# Patient Record
Sex: Male | Born: 1945 | Race: White | Hispanic: No | Marital: Married | Smoking: Former smoker
Health system: Southern US, Community
[De-identification: ages and names within clinical notes are randomized; demographics above are authoritative.]

## PROBLEM LIST (undated history)

## (undated) DIAGNOSIS — E785 Hyperlipidemia, unspecified: Secondary | ICD-10-CM

## (undated) DIAGNOSIS — H1851 Endothelial corneal dystrophy: Secondary | ICD-10-CM

## (undated) DIAGNOSIS — H18519 Endothelial corneal dystrophy, unspecified eye: Secondary | ICD-10-CM

## (undated) DIAGNOSIS — J449 Chronic obstructive pulmonary disease, unspecified: Secondary | ICD-10-CM

## (undated) DIAGNOSIS — K219 Gastro-esophageal reflux disease without esophagitis: Secondary | ICD-10-CM

## (undated) DIAGNOSIS — Z85820 Personal history of malignant melanoma of skin: Secondary | ICD-10-CM

## (undated) DIAGNOSIS — M1990 Unspecified osteoarthritis, unspecified site: Secondary | ICD-10-CM

## (undated) DIAGNOSIS — N320 Bladder-neck obstruction: Secondary | ICD-10-CM

## (undated) DIAGNOSIS — Z9889 Other specified postprocedural states: Secondary | ICD-10-CM

## (undated) DIAGNOSIS — F909 Attention-deficit hyperactivity disorder, unspecified type: Secondary | ICD-10-CM

## (undated) DIAGNOSIS — I444 Left anterior fascicular block: Secondary | ICD-10-CM

## (undated) DIAGNOSIS — Z8546 Personal history of malignant neoplasm of prostate: Secondary | ICD-10-CM

## (undated) DIAGNOSIS — J302 Other seasonal allergic rhinitis: Secondary | ICD-10-CM

## (undated) DIAGNOSIS — N529 Male erectile dysfunction, unspecified: Secondary | ICD-10-CM

## (undated) DIAGNOSIS — R918 Other nonspecific abnormal finding of lung field: Secondary | ICD-10-CM

## (undated) HISTORY — PX: ADENOIDECTOMY: SUR15

## (undated) HISTORY — PX: TONSILLECTOMY: SUR1361

## (undated) HISTORY — DX: Hyperlipidemia, unspecified: E78.5

## (undated) HISTORY — PX: CATARACT EXTRACTION W/ INTRAOCULAR LENS  IMPLANT, BILATERAL: SHX1307

## (undated) HISTORY — DX: Gastro-esophageal reflux disease without esophagitis: K21.9

---

## 1987-10-23 HISTORY — PX: HIATAL HERNIA REPAIR: SHX195

## 1989-06-22 HISTORY — PX: KNEE ARTHROSCOPY: SUR90

## 1995-10-23 HISTORY — PX: TOTAL KNEE ARTHROPLASTY: SHX125

## 1999-08-09 ENCOUNTER — Other Ambulatory Visit: Admission: RE | Admit: 1999-08-09 | Discharge: 1999-08-09 | Payer: Self-pay | Admitting: Gastroenterology

## 1999-08-09 ENCOUNTER — Encounter (INDEPENDENT_AMBULATORY_CARE_PROVIDER_SITE_OTHER): Payer: Self-pay | Admitting: Specialist

## 2003-08-17 ENCOUNTER — Ambulatory Visit (HOSPITAL_COMMUNITY): Admission: RE | Admit: 2003-08-17 | Discharge: 2003-08-17 | Payer: Self-pay | Admitting: Urology

## 2003-08-30 ENCOUNTER — Ambulatory Visit: Admission: RE | Admit: 2003-08-30 | Discharge: 2003-10-18 | Payer: Self-pay | Admitting: Radiation Oncology

## 2003-11-08 ENCOUNTER — Inpatient Hospital Stay (HOSPITAL_COMMUNITY): Admission: RE | Admit: 2003-11-08 | Discharge: 2003-11-10 | Payer: Self-pay | Admitting: Urology

## 2003-11-08 ENCOUNTER — Encounter (INDEPENDENT_AMBULATORY_CARE_PROVIDER_SITE_OTHER): Payer: Self-pay | Admitting: Specialist

## 2003-11-08 HISTORY — PX: OTHER SURGICAL HISTORY: SHX169

## 2004-07-19 DIAGNOSIS — C61 Malignant neoplasm of prostate: Secondary | ICD-10-CM

## 2004-10-13 ENCOUNTER — Ambulatory Visit: Payer: Self-pay | Admitting: Internal Medicine

## 2004-11-13 ENCOUNTER — Ambulatory Visit: Payer: Self-pay | Admitting: Internal Medicine

## 2005-05-11 ENCOUNTER — Ambulatory Visit: Payer: Self-pay | Admitting: Internal Medicine

## 2005-06-07 ENCOUNTER — Ambulatory Visit: Payer: Self-pay | Admitting: Internal Medicine

## 2005-08-07 ENCOUNTER — Ambulatory Visit: Payer: Self-pay | Admitting: Internal Medicine

## 2005-08-14 ENCOUNTER — Ambulatory Visit: Payer: Self-pay | Admitting: Internal Medicine

## 2005-10-12 ENCOUNTER — Ambulatory Visit: Payer: Self-pay | Admitting: Internal Medicine

## 2005-11-20 ENCOUNTER — Ambulatory Visit: Payer: Self-pay | Admitting: Internal Medicine

## 2005-11-27 ENCOUNTER — Ambulatory Visit: Payer: Self-pay | Admitting: Internal Medicine

## 2005-12-25 ENCOUNTER — Ambulatory Visit: Payer: Self-pay | Admitting: Internal Medicine

## 2006-03-28 ENCOUNTER — Ambulatory Visit: Payer: Self-pay | Admitting: Internal Medicine

## 2006-05-27 ENCOUNTER — Ambulatory Visit: Payer: Self-pay | Admitting: Internal Medicine

## 2006-08-29 ENCOUNTER — Ambulatory Visit: Payer: Self-pay | Admitting: Internal Medicine

## 2006-10-17 ENCOUNTER — Ambulatory Visit: Payer: Self-pay | Admitting: Internal Medicine

## 2006-10-17 LAB — CONVERTED CEMR LAB
ALT: 43 units/L — ABNORMAL HIGH (ref 0–40)
AST: 27 units/L (ref 0–37)
Albumin: 3.9 g/dL (ref 3.5–5.2)
Alkaline Phosphatase: 52 units/L (ref 39–117)
BUN: 15 mg/dL (ref 6–23)
CO2: 31 meq/L (ref 19–32)
Calcium: 9.2 mg/dL (ref 8.4–10.5)
Chloride: 104 meq/L (ref 96–112)
Chol/HDL Ratio, serum: 3.9
Cholesterol: 193 mg/dL (ref 0–200)
Creatinine, Ser: 1.1 mg/dL (ref 0.4–1.5)
GFR calc non Af Amer: 72 mL/min
Glomerular Filtration Rate, Af Am: 88 mL/min/{1.73_m2}
Glucose, Bld: 88 mg/dL (ref 70–99)
HDL: 49.8 mg/dL (ref >39.0)
LDL Cholesterol: 120 mg/dL — ABNORMAL HIGH (ref 0–99)
Potassium: 4.4 meq/L (ref 3.5–5.1)
Sodium: 140 meq/L (ref 135–145)
Total Bilirubin: 1.2 mg/dL (ref 0.3–1.2)
Total Protein: 6.5 g/dL (ref 6.0–8.3)
Triglyceride fasting, serum: 117 mg/dL (ref 0–149)
VLDL: 23 mg/dL (ref 0–40)

## 2006-11-08 ENCOUNTER — Ambulatory Visit: Payer: Self-pay | Admitting: Internal Medicine

## 2006-11-14 ENCOUNTER — Ambulatory Visit: Payer: Self-pay

## 2006-11-14 ENCOUNTER — Ambulatory Visit: Payer: Self-pay | Admitting: Cardiology

## 2006-11-18 ENCOUNTER — Ambulatory Visit: Payer: Self-pay

## 2006-11-18 HISTORY — PX: CARDIOVASCULAR STRESS TEST: SHX262

## 2007-01-02 ENCOUNTER — Ambulatory Visit: Payer: Self-pay | Admitting: Internal Medicine

## 2007-02-10 ENCOUNTER — Ambulatory Visit: Payer: Self-pay | Admitting: Internal Medicine

## 2007-02-10 LAB — CONVERTED CEMR LAB
ALT: 43 units/L — ABNORMAL HIGH (ref 0–40)
AST: 29 units/L (ref 0–37)
Albumin: 3.7 g/dL (ref 3.5–5.2)
Alkaline Phosphatase: 45 units/L (ref 39–117)
Bilirubin, Direct: 0.1 mg/dL (ref 0.0–0.3)
Cholesterol: 168 mg/dL (ref 0–200)
HDL: 37.9 mg/dL — ABNORMAL LOW (ref >39.0)
LDL Cholesterol: 107 mg/dL — ABNORMAL HIGH (ref 0–99)
PSA: 0 ng/mL — ABNORMAL LOW (ref 0.10–4.00)
Testosterone: 700.27 ng/dL (ref 350.00–890)
Total Bilirubin: 0.8 mg/dL (ref 0.3–1.2)
Total CHOL/HDL Ratio: 4.4
Total Protein: 6.5 g/dL (ref 6.0–8.3)
Triglycerides: 116 mg/dL (ref 0–149)
VLDL: 23 mg/dL (ref 0–40)

## 2007-02-18 ENCOUNTER — Ambulatory Visit: Payer: Self-pay | Admitting: Internal Medicine

## 2007-03-19 ENCOUNTER — Encounter: Admission: RE | Admit: 2007-03-19 | Discharge: 2007-03-19 | Payer: Self-pay | Admitting: Orthopedic Surgery

## 2007-05-06 DIAGNOSIS — E291 Testicular hypofunction: Secondary | ICD-10-CM | POA: Insufficient documentation

## 2007-05-06 DIAGNOSIS — K589 Irritable bowel syndrome without diarrhea: Secondary | ICD-10-CM | POA: Insufficient documentation

## 2007-05-06 DIAGNOSIS — E785 Hyperlipidemia, unspecified: Secondary | ICD-10-CM | POA: Insufficient documentation

## 2007-05-06 DIAGNOSIS — K219 Gastro-esophageal reflux disease without esophagitis: Secondary | ICD-10-CM | POA: Insufficient documentation

## 2007-05-19 ENCOUNTER — Ambulatory Visit: Payer: Self-pay | Admitting: Internal Medicine

## 2007-05-19 DIAGNOSIS — J309 Allergic rhinitis, unspecified: Secondary | ICD-10-CM | POA: Insufficient documentation

## 2007-05-19 DIAGNOSIS — M1710 Unilateral primary osteoarthritis, unspecified knee: Secondary | ICD-10-CM | POA: Insufficient documentation

## 2007-09-22 ENCOUNTER — Ambulatory Visit: Payer: Self-pay | Admitting: Internal Medicine

## 2007-09-22 DIAGNOSIS — J0110 Acute frontal sinusitis, unspecified: Secondary | ICD-10-CM | POA: Insufficient documentation

## 2007-09-22 DIAGNOSIS — H6120 Impacted cerumen, unspecified ear: Secondary | ICD-10-CM | POA: Insufficient documentation

## 2007-10-02 ENCOUNTER — Ambulatory Visit: Payer: Self-pay | Admitting: Internal Medicine

## 2007-10-02 ENCOUNTER — Telehealth: Payer: Self-pay | Admitting: Internal Medicine

## 2008-01-02 ENCOUNTER — Ambulatory Visit: Payer: Self-pay | Admitting: Internal Medicine

## 2008-01-02 DIAGNOSIS — T887XXA Unspecified adverse effect of drug or medicament, initial encounter: Secondary | ICD-10-CM | POA: Insufficient documentation

## 2008-01-02 LAB — CONVERTED CEMR LAB
BUN: 14 mg/dL (ref 6–23)
CO2: 30 meq/L (ref 19–32)
Calcium: 9.8 mg/dL (ref 8.4–10.5)
Chloride: 105 meq/L (ref 96–112)
Creatinine, Ser: 1.2 mg/dL (ref 0.4–1.5)
GFR calc Af Amer: 79 mL/min
GFR calc non Af Amer: 65 mL/min
Glucose, Bld: 92 mg/dL (ref 70–99)
Potassium: 4.8 meq/L (ref 3.5–5.1)
Sodium: 142 meq/L (ref 135–145)

## 2008-01-09 ENCOUNTER — Ambulatory Visit: Payer: Self-pay | Admitting: Cardiology

## 2008-01-13 ENCOUNTER — Telehealth: Payer: Self-pay | Admitting: Internal Medicine

## 2008-01-14 ENCOUNTER — Telehealth (INDEPENDENT_AMBULATORY_CARE_PROVIDER_SITE_OTHER): Payer: Self-pay | Admitting: *Deleted

## 2008-01-16 ENCOUNTER — Ambulatory Visit: Payer: Self-pay | Admitting: Critical Care Medicine

## 2008-01-16 DIAGNOSIS — J984 Other disorders of lung: Secondary | ICD-10-CM

## 2008-01-16 LAB — CONVERTED CEMR LAB: PSA: 0 ng/mL — ABNORMAL LOW (ref 0.10–4.00)

## 2008-01-28 ENCOUNTER — Ambulatory Visit: Payer: Self-pay | Admitting: Internal Medicine

## 2008-02-13 ENCOUNTER — Ambulatory Visit: Payer: Self-pay | Admitting: Internal Medicine

## 2008-02-13 LAB — CONVERTED CEMR LAB
ALT: 52 units/L (ref 0–53)
AST: 27 units/L (ref 0–37)
Albumin: 3.8 g/dL (ref 3.5–5.2)
Alkaline Phosphatase: 39 units/L (ref 39–117)
Bilirubin, Direct: 0.1 mg/dL (ref 0.0–0.3)
Cholesterol: 165 mg/dL (ref 0–200)
HDL: 38.6 mg/dL — ABNORMAL LOW (ref >39.0)
LDL Cholesterol: 110 mg/dL — ABNORMAL HIGH (ref 0–99)
Total Bilirubin: 0.8 mg/dL (ref 0.3–1.2)
Total CHOL/HDL Ratio: 4.3
Total Protein: 6.7 g/dL (ref 6.0–8.3)
Triglycerides: 80 mg/dL (ref 0–149)
VLDL: 16 mg/dL (ref 0–40)

## 2008-02-20 ENCOUNTER — Ambulatory Visit: Payer: Self-pay | Admitting: Internal Medicine

## 2008-03-09 ENCOUNTER — Encounter: Payer: Self-pay | Admitting: Internal Medicine

## 2008-04-01 ENCOUNTER — Telehealth: Payer: Self-pay | Admitting: Critical Care Medicine

## 2008-04-02 ENCOUNTER — Ambulatory Visit: Payer: Self-pay | Admitting: Cardiology

## 2008-04-02 ENCOUNTER — Encounter: Payer: Self-pay | Admitting: Critical Care Medicine

## 2008-04-06 ENCOUNTER — Ambulatory Visit: Payer: Self-pay | Admitting: Internal Medicine

## 2008-04-06 DIAGNOSIS — K7689 Other specified diseases of liver: Secondary | ICD-10-CM

## 2008-07-06 ENCOUNTER — Ambulatory Visit: Payer: Self-pay | Admitting: Internal Medicine

## 2008-07-06 LAB — CONVERTED CEMR LAB
ALT: 29 units/L (ref 0–53)
AST: 25 units/L (ref 0–37)
Albumin: 3.8 g/dL (ref 3.5–5.2)
Alkaline Phosphatase: 33 units/L — ABNORMAL LOW (ref 39–117)
Bilirubin, Direct: 0.1 mg/dL (ref 0.0–0.3)
Cholesterol: 151 mg/dL (ref 0–200)
HDL: 36.8 mg/dL — ABNORMAL LOW (ref >39.0)
LDL Cholesterol: 101 mg/dL — ABNORMAL HIGH (ref 0–99)
Total Bilirubin: 0.8 mg/dL (ref 0.3–1.2)
Total CHOL/HDL Ratio: 4.1
Total Protein: 7.4 g/dL (ref 6.0–8.3)
Triglycerides: 68 mg/dL (ref 0–149)
VLDL: 14 mg/dL (ref 0–40)

## 2008-07-12 ENCOUNTER — Ambulatory Visit: Payer: Self-pay | Admitting: Internal Medicine

## 2008-07-12 DIAGNOSIS — L570 Actinic keratosis: Secondary | ICD-10-CM

## 2008-07-16 LAB — CONVERTED CEMR LAB: Vit D, 1,25-Dihydroxy: 35 (ref 30–89)

## 2008-08-02 ENCOUNTER — Telehealth: Payer: Self-pay | Admitting: Internal Medicine

## 2008-10-22 HISTORY — PX: OTHER SURGICAL HISTORY: SHX169

## 2008-11-18 ENCOUNTER — Encounter: Payer: Self-pay | Admitting: Internal Medicine

## 2008-12-07 ENCOUNTER — Encounter: Payer: Self-pay | Admitting: Critical Care Medicine

## 2008-12-07 ENCOUNTER — Ambulatory Visit: Payer: Self-pay | Admitting: Cardiology

## 2008-12-24 ENCOUNTER — Ambulatory Visit: Payer: Self-pay | Admitting: Internal Medicine

## 2008-12-24 LAB — CONVERTED CEMR LAB
ALT: 31 units/L (ref 0–53)
AST: 23 units/L (ref 0–37)
Albumin: 3.8 g/dL (ref 3.5–5.2)
Alkaline Phosphatase: 35 units/L — ABNORMAL LOW (ref 39–117)
BUN: 18 mg/dL (ref 6–23)
Basophils Absolute: 0 10*3/uL (ref 0.0–0.1)
Basophils Relative: 0.1 % (ref 0.0–3.0)
Bilirubin Urine: NEGATIVE
Bilirubin, Direct: 0.1 mg/dL (ref 0.0–0.3)
CO2: 33 meq/L — ABNORMAL HIGH (ref 19–32)
Calcium: 9.7 mg/dL (ref 8.4–10.5)
Chloride: 103 meq/L (ref 96–112)
Cholesterol: 161 mg/dL (ref 0–200)
Creatinine, Ser: 1.3 mg/dL (ref 0.4–1.5)
Eosinophils Absolute: 0.2 10*3/uL (ref 0.0–0.7)
Eosinophils Relative: 4.2 % (ref 0.0–5.0)
GFR calc Af Amer: 71 mL/min
GFR calc non Af Amer: 59 mL/min
Glucose, Bld: 94 mg/dL (ref 70–99)
Glucose, Urine, Semiquant: NEGATIVE
HCT: 47.7 % (ref 39.0–52.0)
HDL: 43.3 mg/dL (ref >39.0)
Hemoglobin: 16.3 g/dL (ref 13.0–17.0)
Ketones, urine, test strip: NEGATIVE
LDL Cholesterol: 90 mg/dL (ref 0–99)
Lymphocytes Relative: 35 % (ref 12.0–46.0)
MCHC: 34.2 g/dL (ref 30.0–36.0)
MCV: 88 fL (ref 78.0–100.0)
Monocytes Absolute: 0.5 10*3/uL (ref 0.1–1.0)
Monocytes Relative: 8.2 % (ref 3.0–12.0)
Neutro Abs: 2.9 10*3/uL (ref 1.4–7.7)
Neutrophils Relative %: 52.5 % (ref 43.0–77.0)
Nitrite: NEGATIVE
PSA: 0.02 ng/mL — ABNORMAL LOW (ref 0.10–4.00)
Platelets: 186 10*3/uL (ref 150–400)
Potassium: 4.2 meq/L (ref 3.5–5.1)
Protein, U semiquant: NEGATIVE
RBC: 5.42 M/uL (ref 4.22–5.81)
RDW: 12.3 % (ref 11.5–14.6)
Sodium: 145 meq/L (ref 135–145)
Specific Gravity, Urine: 1.02
TSH: 1.04 microintl units/mL (ref 0.35–5.50)
Total Bilirubin: 0.9 mg/dL (ref 0.3–1.2)
Total CHOL/HDL Ratio: 3.7
Total Protein: 7 g/dL (ref 6.0–8.3)
Triglycerides: 137 mg/dL (ref 0–149)
Urobilinogen, UA: 0.2
VLDL: 27 mg/dL (ref 0–40)
WBC Urine, dipstick: NEGATIVE
WBC: 5.6 10*3/uL (ref 4.5–10.5)
pH: 6

## 2008-12-31 ENCOUNTER — Ambulatory Visit: Payer: Self-pay | Admitting: Internal Medicine

## 2009-02-22 ENCOUNTER — Telehealth: Payer: Self-pay | Admitting: Internal Medicine

## 2009-03-04 ENCOUNTER — Ambulatory Visit: Payer: Self-pay | Admitting: Psychology

## 2009-03-23 ENCOUNTER — Encounter: Payer: Self-pay | Admitting: Internal Medicine

## 2009-04-01 ENCOUNTER — Ambulatory Visit: Payer: Self-pay | Admitting: Psychology

## 2009-04-22 ENCOUNTER — Ambulatory Visit: Payer: Self-pay | Admitting: Psychology

## 2009-05-12 ENCOUNTER — Ambulatory Visit: Payer: Self-pay | Admitting: Psychology

## 2009-05-27 ENCOUNTER — Ambulatory Visit: Payer: Self-pay | Admitting: Psychology

## 2009-06-03 ENCOUNTER — Telehealth: Payer: Self-pay | Admitting: Internal Medicine

## 2009-06-03 DIAGNOSIS — M79609 Pain in unspecified limb: Secondary | ICD-10-CM

## 2009-06-08 ENCOUNTER — Encounter: Payer: Self-pay | Admitting: Internal Medicine

## 2009-06-09 ENCOUNTER — Telehealth: Payer: Self-pay | Admitting: Internal Medicine

## 2009-06-09 ENCOUNTER — Ambulatory Visit: Payer: Self-pay | Admitting: Psychology

## 2009-06-16 ENCOUNTER — Ambulatory Visit: Payer: Self-pay | Admitting: Psychology

## 2009-06-23 ENCOUNTER — Ambulatory Visit: Payer: Self-pay | Admitting: Psychology

## 2009-06-24 ENCOUNTER — Ambulatory Visit: Payer: Self-pay | Admitting: Internal Medicine

## 2009-06-24 LAB — CONVERTED CEMR LAB
ALT: 40 units/L (ref 0–53)
AST: 31 units/L (ref 0–37)
Albumin: 4 g/dL (ref 3.5–5.2)
Alkaline Phosphatase: 31 units/L — ABNORMAL LOW (ref 39–117)
Bilirubin, Direct: 0.2 mg/dL (ref 0.0–0.3)
Cholesterol: 144 mg/dL (ref 0–200)
HDL: 41.5 mg/dL (ref >39.00)
LDL Cholesterol: 87 mg/dL (ref 0–99)
Total Bilirubin: 1.2 mg/dL (ref 0.3–1.2)
Total CHOL/HDL Ratio: 3
Total Protein: 7.1 g/dL (ref 6.0–8.3)
Triglycerides: 77 mg/dL (ref 0.0–149.0)
VLDL: 15.4 mg/dL (ref 0.0–40.0)

## 2009-07-05 ENCOUNTER — Ambulatory Visit: Payer: Self-pay | Admitting: Internal Medicine

## 2009-07-05 LAB — CONVERTED CEMR LAB
Cholesterol, target level: 200 mg/dL
HDL goal, serum: 40 mg/dL
LDL Goal: 100 mg/dL

## 2009-07-06 ENCOUNTER — Encounter (INDEPENDENT_AMBULATORY_CARE_PROVIDER_SITE_OTHER): Payer: Self-pay | Admitting: *Deleted

## 2009-07-15 ENCOUNTER — Ambulatory Visit: Payer: Self-pay | Admitting: Psychology

## 2009-07-29 ENCOUNTER — Telehealth: Payer: Self-pay | Admitting: Internal Medicine

## 2009-08-05 ENCOUNTER — Ambulatory Visit: Payer: Self-pay | Admitting: Psychology

## 2009-11-29 ENCOUNTER — Encounter: Payer: Self-pay | Admitting: Critical Care Medicine

## 2009-12-08 ENCOUNTER — Encounter: Payer: Self-pay | Admitting: Critical Care Medicine

## 2009-12-08 ENCOUNTER — Ambulatory Visit: Payer: Self-pay | Admitting: Cardiology

## 2009-12-22 ENCOUNTER — Telehealth (INDEPENDENT_AMBULATORY_CARE_PROVIDER_SITE_OTHER): Payer: Self-pay | Admitting: *Deleted

## 2010-03-14 ENCOUNTER — Telehealth: Payer: Self-pay | Admitting: Gastroenterology

## 2010-03-15 ENCOUNTER — Encounter: Payer: Self-pay | Admitting: Internal Medicine

## 2010-05-02 ENCOUNTER — Encounter: Payer: Self-pay | Admitting: Internal Medicine

## 2010-05-08 ENCOUNTER — Telehealth: Payer: Self-pay | Admitting: Internal Medicine

## 2010-05-10 ENCOUNTER — Encounter: Payer: Self-pay | Admitting: Internal Medicine

## 2010-08-02 ENCOUNTER — Telehealth (INDEPENDENT_AMBULATORY_CARE_PROVIDER_SITE_OTHER): Payer: Self-pay | Admitting: *Deleted

## 2010-08-16 ENCOUNTER — Encounter: Payer: Self-pay | Admitting: Internal Medicine

## 2010-09-20 ENCOUNTER — Telehealth: Payer: Self-pay | Admitting: Internal Medicine

## 2010-09-22 ENCOUNTER — Ambulatory Visit: Payer: Self-pay | Admitting: Internal Medicine

## 2010-09-22 ENCOUNTER — Encounter: Payer: Self-pay | Admitting: Internal Medicine

## 2010-09-22 DIAGNOSIS — J45901 Unspecified asthma with (acute) exacerbation: Secondary | ICD-10-CM

## 2010-09-22 DIAGNOSIS — J441 Chronic obstructive pulmonary disease with (acute) exacerbation: Secondary | ICD-10-CM

## 2010-09-22 DIAGNOSIS — R0602 Shortness of breath: Secondary | ICD-10-CM | POA: Insufficient documentation

## 2010-10-22 HISTORY — PX: CORNEAL TRANSPLANT: SHX108

## 2010-10-31 ENCOUNTER — Ambulatory Visit
Admission: RE | Admit: 2010-10-31 | Discharge: 2010-10-31 | Payer: Self-pay | Source: Home / Self Care | Attending: Internal Medicine | Admitting: Internal Medicine

## 2010-11-12 ENCOUNTER — Encounter: Payer: Self-pay | Admitting: Critical Care Medicine

## 2010-11-23 NOTE — Letter (Signed)
Summary: [facility]   [facility]   Imported By: Maryln Gottron 08/22/2010 09:32:39  _____________________________________________________________________  External Attachment:    Type:   Image     Comment:   External Document

## 2010-11-23 NOTE — Progress Notes (Signed)
Summary: Schedule Colonoscopy   Phone Note Outgoing Call   Call placed by: Lamona Curl CMA Duncan Dull),  Mar 14, 2010 3:42 PM Call placed to: Patient Summary of Call: Patient is overdue for colonoscopy. His last colonoscopy in 2000 showed diverticulosis and internal hemorrhoids. He also has a family history of colon cancer in his father. I have left a message for the patient to call back.  Initial call taken by: Lamona Curl CMA Duncan Dull),  Mar 14, 2010 3:49 PM  Follow-up for Phone Call        Spoke with male who states that patient is not at home but that she will have him to call back.  Follow-up by: Lamona Curl CMA (AAMA),  Mar 21, 2010 10:05 AM     Appended Document: Schedule Colonoscopy Patient never called back. We will send a letter.

## 2010-11-23 NOTE — Letter (Signed)
Summary: [facility]  [facility]   Imported By: Maryln Gottron 03/29/2010 15:34:07  _____________________________________________________________________  External Attachment:    Type:   Image     Comment:   External Document

## 2010-11-23 NOTE — Progress Notes (Signed)
Summary: SOB  Phone Note Call from Patient Call back at Work Phone [phone]   Caller: Patient Call For: Stacie Glaze MD Reason for Call: Acute Illness Summary of Call: Pt is having SOB when exerting himself x several months , and wants to be seen.  No chest pain. Initial call taken by: [facility] CMA AAMA,  September 20, 2010 4:31 PM  Follow-up for Phone Call        he may co me in tomroow at 9:30-thanks norma Follow-up by: Willy Eddy, LPN,  September 20, 2010 4:36 PM  Additional Follow-up for Phone Call Additional follow up Details #1::        09-22-2010 11.30AM Additional Follow-up by: Heron Sabins,  September 20, 2010 4:51 PM

## 2010-11-23 NOTE — Assessment & Plan Note (Signed)
Summary: 1 month rov/njr   Vital Signs:  Patient profile:   65 year old male Height:      71 inches Weight:      220 pounds BMI:     30.79 Temp:     98.2 degrees F oral Pulse rate:   68 / minute Resp:     14 per minute BP sitting:   120 / 80  (left arm)  Vitals Entered By: Willy Eddy, LPN (October 31, 2010 12:19 PM) CC: f/u on advair- good results with no side effects and no more sob episodes Is Patient Diabetic? No   Primary Care Provider:  Dr. Darryll Capers  CC:  f/u on advair- good results with no side effects and no more sob episodes.  History of Present Illness: pt wants to try off the tricor with diet  and exercize he has done well with the advair but wants to loose 50 pounds and not need drugs He has agreed to [facility] with a rescue inhailer and call if using more that 2 times a week.  analysis of pulmonary function study had at last visit show that he had a reactive asthma that seemed to flare after cough.  Preventive Screening-Counseling & Management  Alcohol-Tobacco     Smoking Status: quit     Year Quit: 1992     Pack years: 1-2 ppd x26 years     Passive Smoke Exposure: no     Tobacco Counseling: to remain off tobacco products  Problems Prior to Update: 1)  Chronic Obstructive Asthma With Exacerbation  (ICD-493.22) 2)  Shortness of Breath  (ICD-786.05) 3)  Heel Pain  (ICD-729.5) 4)  Preventive Health Care  (ICD-V70.0) 5)  Actinic Keratosis, Forehead, Left  (ICD-702.0) 6)  Fatty Liver Disease  (ICD-571.8) 7)  Pulmonary Nodule, Right Lower Lobe  (ICD-518.89) 8)  Uns Advrs Eff Uns Rx Medicinal&biological Sbstnc  (ICD-995.20) 9)  Cerumen Impaction, Bilateral  (ICD-380.4) 10)  Acute Frontal Sinusitis  (ICD-461.1) 11)  Allergic Rhinitis  (ICD-477.9) 12)  Osteoarthrosis, Local Nos, Lower Leg  (ICD-715.36) 13)  Neop, Malignant, Prostate  (ICD-185) 14)  Irritable Bowel Syndrome  (ICD-564.1) 15)  Testosterone Deficiency  (ICD-257.2) 16)  Hyperlipidemia   (ICD-272.4) 17)  Gerd  (ICD-530.81)  Current Problems (verified): 1)  Chronic Obstructive Asthma With Exacerbation  (ICD-493.22) 2)  Shortness of Breath  (ICD-786.05) 3)  Heel Pain  (ICD-729.5) 4)  Preventive Health Care  (ICD-V70.0) 5)  Actinic Keratosis, Forehead, Left  (ICD-702.0) 6)  Fatty Liver Disease  (ICD-571.8) 7)  Pulmonary Nodule, Right Lower Lobe  (ICD-518.89) 8)  Uns Advrs Eff Uns Rx Medicinal&biological Sbstnc  (ICD-995.20) 9)  Cerumen Impaction, Bilateral  (ICD-380.4) 10)  Acute Frontal Sinusitis  (ICD-461.1) 11)  Allergic Rhinitis  (ICD-477.9) 12)  Osteoarthrosis, Local Nos, Lower Leg  (ICD-715.36) 13)  Neop, Malignant, Prostate  (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 On By Mouth Daily 2)  Cialis 20 Mg  Tabs (Tadalafil) .... One By Mouth One Hour Prior 3)  Testim 1 %  Gel (Testosterone) .... Two Packets To Skin As Directed Daily 4)  Bayer Low Strength 81 Mg  Tbec (Aspirin) .... Once Daily 5)  Wellbutrin Xl 150 Mg Xr24h-Tab (Bupropion Hcl) .Marland Kitchen.. 1 Once Daily 6)  Imodium A-D 2 Mg Tabs (Loperamide Hcl) .... As Needed  7)  Protonix 40 Mg Tbec (Pantoprazole Sodium) .Marland Kitchen.. 1 Qd 8)  Advair Diskus 250-50 Mcg/dose Aepb (Fluticasone-Salmeterol) .... One Puff Daily  Current Medications (verified): 1)  Tricor 145 Mg  Tabs (Fenofibrate) .... On By Mouth Daily 2)  Cialis 20 Mg  Tabs (Tadalafil) .... One By Mouth One Hour Prior 3)  Testim 1 %  Gel (Testosterone) .... Two Packets To Skin As Directed Daily 4)  Bayer Low Strength 81 Mg  Tbec (Aspirin) .... Once Daily 5)  Wellbutrin Xl 150 Mg Xr24h-Tab (Bupropion Hcl) .Marland Kitchen.. 1 Once Daily 6)  Imodium A-D 2 Mg Tabs (Loperamide Hcl) .... As Needed 7)  Protonix 40 Mg Tbec (Pantoprazole Sodium) .Marland Kitchen.. 1 Qd 8)  Advair Diskus 250-50 Mcg/dose Aepb  (Fluticasone-Salmeterol) .... One Puff Daily  Allergies (verified): No Known Drug Allergies  Past History:  Family History: Last updated: 01/16/2008 Family History Colon Cancer PGF Family History Lung Cancer  PGF, Father  Family History Prostate Cancer PGF brother ALS deceased  Social History: Last updated: 01/16/2008 Married Former Smoker Geophysical data processor  Risk Factors: Smoking Status: quit (10/31/2010) Passive Smoke Exposure: no (10/31/2010)  Past medical, surgical, family and social histories (including risk factors) reviewed, and no changes noted (except as noted below).  Past Medical History: Reviewed history from 01/16/2008 and no changes required.  UNS ADVRS EFF UNS RX MEDICINAL&BIOLOGICAL SBSTNC (ICD-995.20) OBST CHRONIC BRONCHITIS W/ACUTE BRONCHITIS (ICD-491.22) CERUMEN IMPACTION, BILATERAL (ICD-380.4) ACUTE FRONTAL SINUSITIS (ICD-461.1) ALLERGIC RHINITIS (ICD-477.9) OSTEOARTHROSIS, LOCAL NOS, LOWER LEG (ICD-715.36) NEOP, MALIGNANT, PROSTATE (ICD-185) IRRITABLE BOWEL SYNDROME (ICD-564.1) TESTOSTERONE DEFICIENCY (ICD-257.2) HYPERLIPIDEMIA (ICD-272.4) GERD (ICD-530.81)  Past Surgical History: Reviewed history from 01/16/2008 and no changes required. hiatal hernia 1989 Total knee replacement right 1997 Prostatectomy  2005   PSA 0 since that time  last one one year ago left knee arthroscopy tonsilectory  Family History: Reviewed history from 01/16/2008 and no changes required. Family History Colon Cancer PGF Family History Lung Cancer  PGF, Father  Family History Prostate Cancer PGF brother ALS deceased  Social History: Reviewed history from 01/16/2008 and no changes required. Married Former Personal assistant  Review of Systems  The patient denies anorexia, fever, weight loss, weight gain, vision loss, decreased hearing, hoarseness, chest pain, syncope, dyspnea on exertion, peripheral edema, prolonged cough, headaches, hemoptysis,  abdominal pain, melena, hematochezia, severe indigestion/heartburn, hematuria, incontinence, genital sores, muscle weakness, suspicious skin lesions, transient blindness, difficulty walking, depression, unusual weight change, abnormal bleeding, enlarged lymph nodes, angioedema, and breast masses.    Physical Exam  General:  alert.  well-developed.   Head:  normocephalic and atraumatic.  male-pattern balding.   Eyes:  pupils equal and pupils round.   Ears:  R ear normal and L ear normal.   Nose:  no external deformity and no nasal discharge.   Mouth:  pharynx pink and moist.   Neck:  No deformities, masses, or tenderness noted. Lungs:  normal respiratory effort and no wheezes.  untill during the effort of the respiratory function tests then he developed wheezing after cough Heart:  normal rate and regular rhythm.   Abdomen:  soft and non-tender.     Impression & Recommendations:  Problem # 1:  CHRONIC OBSTRUCTIVE ASTHMA WITH EXACERBATION (ICD-493.22) Assessment Unchanged if the pt has more that two flairs a week His updated medication list for this problem includes:    Advair Diskus 250-50 Mcg/dose Aepb (Fluticasone-salmeterol) ..... One puff daily  Pulmonary Functions Reviewed: O2 sat: 95 (01/16/2008)  Problem # 2:  SHORTNESS OF BREATH (ICD-786.05)  due to asthma  symptoms have resolved  Problem # 3:  GERD (ICD-530.81)  His updated medication list for this problem includes:    Protonix 40 Mg Tbec (Pantoprazole sodium) .Marland Kitchen... 1 qd  Labs Reviewed: Hgb: 16.3 (12/24/2008)   Hct: 47.7 (12/24/2008)  Complete Medication List: 1)  Tricor 145 Mg Tabs (Fenofibrate) .... On by mouth daily 2)  Cialis 20 Mg Tabs (Tadalafil) .... One by mouth one hour prior 3)  Testim 1 % Gel (Testosterone) .... Two packets to skin as directed daily 4)  Bayer Low Strength 81 Mg Tbec (Aspirin) .... Once daily 5)  Wellbutrin Xl 150 Mg Xr24h-tab (Bupropion hcl) .Marland Kitchen.. 1 once daily 6)  Imodium A-d 2 Mg Tabs  (Loperamide hcl) .... As needed 7)  Protonix 40 Mg Tbec (Pantoprazole sodium) .Marland Kitchen.. 1 qd 8)  Advair Diskus 250-50 Mcg/dose Aepb (Fluticasone-salmeterol) .... One puff daily  Patient Instructions: 1)  Please schedule a follow-up appointment in 2 months. 2)  Hepatic Panel prior to visit, ICD-9:995.20 3)  Lipid Panel prior to visit, ICD-9:272.4   Orders Added: 1)  Est. Patient Level IV [16109]

## 2010-11-23 NOTE — Letter (Signed)
Summary: [facility]  [facility]   Imported By: Maryln Gottron 05/08/2010 11:27:48  _____________________________________________________________________  External Attachment:    Type:   Image     Comment:   External Document

## 2010-11-23 NOTE — Progress Notes (Signed)
  Phone Note Other Incoming   Request: Send information Summary of Call: Received a completed [facility] medical release form from patient. Patient is requesting copies of records from [phone] to be mailed to Dr. Joanna Puff @ [facility]. Request forwarded to [employer].

## 2010-11-23 NOTE — Progress Notes (Signed)
Summary: Testosterone Level  Phone Note Call from Patient Call back at Work Phone [phone]   Caller: Patient Reason for Call: Acute Illness Summary of Call: Insurance company is requesting documentation of testosterone levels at the time I was first prescribed Testim Initial call taken by: Trixie Dredge,  May 08, 2010 4:43 PM  Follow-up for Phone Call        chrt has been called for.252 and faxed to dr Patsi Sears Follow-up by: Willy Eddy, LPN,  May 08, 2010 4:53 PM

## 2010-11-23 NOTE — Assessment & Plan Note (Signed)
Summary: exertional sob/bmw   Vital Signs:  Patient profile:   65 year old male Height:      71 inches Weight:      234 pounds BMI:     32.75 Temp:     98.5 degrees F oral Pulse rate:   72 / minute Resp:     14 per minute BP sitting:   110 / 80  (left arm)  Vitals Entered By: Willy Eddy, LPN (September 22, 2010 12:16 PM) CC: c/o exertional sob for >1 month Is Patient Diabetic? No   Primary Care Provider:  Dr. Darryll Capers  CC:  c/o exertional sob for >1 month.  History of Present Illness: the pt has mild reactive airway disease. SOB post cough and post testing. Has a cough and wheeze in the AM and wheezes with exertion No chest pain, swelling in feet or PND weigth gain, hx of copd and age and 50 py hx of smoking negative CT's  2010  Preventive Screening-Counseling & Management  Alcohol-Tobacco     Smoking Status: quit     Year Quit: 1992     Pack years: 1-2 ppd x26 years     Passive Smoke Exposure: no     Tobacco Counseling: to remain off tobacco products  Problems Prior to Update: 1)  Chronic Obstructive Asthma With Exacerbation  (ICD-493.22) 2)  Shortness of Breath  (ICD-786.05) 3)  Heel Pain  (ICD-729.5) 4)  Preventive Health Care  (ICD-V70.0) 5)  Actinic Keratosis, Forehead, Left  (ICD-702.0) 6)  Fatty Liver Disease  (ICD-571.8) 7)  Chronic Obstructive Pulmonary Disease, Acute Exacerbation  (ICD-491.21) 8)  Pulmonary Nodule, Right Lower Lobe  (ICD-518.89) 9)  Uns Advrs Eff Uns Rx Medicinal&biological Sbstnc  (ICD-995.20) 10)  Obst Chronic Bronchitis W/acute Bronchitis  (ICD-491.22) 11)  Cerumen Impaction, Bilateral  (ICD-380.4) 12)  Acute Frontal Sinusitis  (ICD-461.1) 13)  Allergic Rhinitis  (ICD-477.9) 14)  Osteoarthrosis, Local Nos, Lower Leg  (ICD-715.36) 15)  Neop, Malignant, Prostate  (ICD-185) 16)  Irritable Bowel Syndrome  (ICD-564.1) 17)  Testosterone Deficiency  (ICD-257.2) 18)  Hyperlipidemia  (ICD-272.4) 19)  Gerd   (ICD-530.81)  Medications Prior to Update: 1)  Tricor 145 Mg  Tabs (Fenofibrate) .... On By Mouth Daily 2)  Cialis 20 Mg  Tabs (Tadalafil) .... One By Mouth One Hour Prior 3)  Testim 1 %  Gel (Testosterone) .... Two Packets To Skin As Directed Daily 4)  Bayer Low Strength 81 Mg  Tbec (Aspirin) .... Once Daily 5)  Wellbutrin Xl 150 Mg Xr24h-Tab (Bupropion Hcl) .Marland Kitchen.. 1 Once Daily 6)  Imodium A-D 2 Mg Tabs (Loperamide Hcl) .... As Needed 7)  Protonix 40 Mg Tbec (Pantoprazole Sodium) .Marland Kitchen.. 1 Qd  Current Medications (verified): 1)  Tricor 145 Mg  Tabs (Fenofibrate) .... On By Mouth Daily 2)  Cialis 20 Mg  Tabs (Tadalafil) .... One By Mouth One Hour Prior 3)  Testim 1 %  Gel (Testosterone) .... Two Packets To Skin As Directed Daily 4)  Bayer Low Strength 81 Mg  Tbec (Aspirin) .... Once Daily 5)  Wellbutrin Xl 150 Mg Xr24h-Tab (Bupropion Hcl) .Marland Kitchen.. 1 Once Daily 6)  Imodium A-D 2 Mg Tabs (Loperamide Hcl) .... As Needed 7)  Protonix 40 Mg Tbec (Pantoprazole Sodium) .Marland Kitchen.. 1 Qd 8)  Advair Diskus 250-50 Mcg/dose Aepb (Fluticasone-Salmeterol) .... One Puff Daily  Allergies (verified): No Known Drug Allergies  Past History:  Family History: Last updated: 01/16/2008 Family History Colon Cancer PGF Family History Lung Cancer  PGF, Father  Family History Prostate Cancer PGF brother ALS deceased  Social History: Last updated: 01/16/2008 Married Former Smoker Geophysical data processor  Risk Factors: Smoking Status: quit (09/22/2010) Passive Smoke Exposure: no (09/22/2010)  Past medical, surgical, family and social histories (including risk factors) reviewed, and no changes noted (except as noted below).  Past Medical History: Reviewed history from 01/16/2008 and no changes required.  UNS ADVRS EFF UNS RX MEDICINAL&BIOLOGICAL SBSTNC (ICD-995.20) OBST CHRONIC BRONCHITIS W/ACUTE BRONCHITIS (ICD-491.22) CERUMEN IMPACTION, BILATERAL (ICD-380.4) ACUTE FRONTAL SINUSITIS (ICD-461.1) ALLERGIC RHINITIS  (ICD-477.9) OSTEOARTHROSIS, LOCAL NOS, LOWER LEG (ICD-715.36) NEOP, MALIGNANT, PROSTATE (ICD-185) IRRITABLE BOWEL SYNDROME (ICD-564.1) TESTOSTERONE DEFICIENCY (ICD-257.2) HYPERLIPIDEMIA (ICD-272.4) GERD (ICD-530.81)  Past Surgical History: Reviewed history from 01/16/2008 and no changes required. hiatal hernia 1989 Total knee replacement right 1997 Prostatectomy  2005   PSA 0 since that time  last one one year ago left knee arthroscopy tonsilectory  Family History: Reviewed history from 01/16/2008 and no changes required. Family History Colon Cancer PGF Family History Lung Cancer  PGF, Father  Family History Prostate Cancer PGF brother ALS deceased  Social History: Reviewed history from 01/16/2008 and no changes required. Married Former Personal assistant  Review of Systems  The patient denies anorexia, fever, weight loss, weight gain, vision loss, decreased hearing, hoarseness, chest pain, syncope, dyspnea on exertion, peripheral edema, prolonged cough, headaches, hemoptysis, abdominal pain, melena, hematochezia, severe indigestion/heartburn, hematuria, incontinence, genital sores, muscle weakness, suspicious skin lesions, transient blindness, difficulty walking, depression, unusual weight change, abnormal bleeding, enlarged lymph nodes, angioedema, and breast masses.    Physical Exam  General:  alert.  well-developed.   Head:  normocephalic and atraumatic.  male-pattern balding.   Eyes:  pupils equal and pupils round.   Ears:  R ear normal and L ear normal.   Nose:  no external deformity and no nasal discharge.   Mouth:  pharynx pink and moist.   Neck:  No deformities, masses, or tenderness noted. Lungs:  normal respiratory effort and no wheezes.  untill during the effort of the respiratory function tests then he developed wheezing after cough Heart:  normal rate and regular rhythm.   Abdomen:  soft and non-tender.     Impression & Recommendations:  Problem  # 1:  CHRONIC OBSTRUCTIVE ASTHMA WITH EXACERBATION (JXB-147.82) Assessment New new diagnosis, hx of smoking needs to start a maintanence inhailer dicussion of results of pulmonary functions  Pulmonary Functions Reviewed: O2 sat: 95 (01/16/2008)  His updated medication list for this problem includes:    Advair Diskus 250-50 Mcg/dose Aepb (Fluticasone-salmeterol) ..... One puff daily  Problem # 2:  SHORTNESS OF BREATH (ICD-786.05) Assessment: New EKG unchanged spiromtry noted restrictive lung and  on exam noted wheezing after cough Orders: EKG w/ Interpretation (93000) Spirometry w/Graph (94010)  Problem # 3:  GERD (ICD-530.81) Assessment: Unchanged  His updated medication list for this problem includes:    Protonix 40 Mg Tbec (Pantoprazole sodium) .Marland Kitchen... 1 qd  Labs Reviewed: Hgb: 16.3 (12/24/2008)   Hct: 47.7 (12/24/2008)  Complete Medication List: 1)  Tricor 145 Mg Tabs (Fenofibrate) .... On by mouth daily 2)  Cialis 20 Mg Tabs (Tadalafil) .... One by mouth one hour prior 3)  Testim 1 % Gel (Testosterone) .... Two packets to skin as directed daily 4)  Bayer Low Strength 81 Mg Tbec (Aspirin) .... Once daily 5)  Wellbutrin Xl 150 Mg Xr24h-tab (Bupropion hcl) .Marland Kitchen.. 1 once daily 6)  Imodium A-d 2 Mg Tabs (Loperamide hcl) .... As needed 7)  Protonix  40 Mg Tbec (Pantoprazole sodium) .Marland Kitchen.. 1 qd 8)  Advair Diskus 250-50 Mcg/dose Aepb (Fluticasone-salmeterol) .... One puff daily  Patient Instructions: 1)  Please schedule a follow-up appointment in 1 month.   Orders Added: 1)  EKG w/ Interpretation [93000] 2)  Spirometry w/Graph [94010] 3)  Est. Patient Level IV [16109]

## 2010-11-23 NOTE — Miscellaneous (Signed)
Summary: Orders Update   Clinical Lists Changes  Orders: Added new Referral order of Radiology Referral (Radiology) - Signed 

## 2010-11-23 NOTE — Miscellaneous (Signed)
Summary: CT Chest    Clinical Lists Changes  Observations: Added new observation of CT OF CHEST: IMPRESSION:   1.  Very small pulmonary nodules are stable from 01/09/2008.  The lack of interval change indicates benign etiology.  Based on current guidelines, no further imaging workup is necessary. 2.  Diffuse hepatic steatosis. 3.  Mild paraseptal emphysema. (12/08/2009 14:23)      CT of Chest  Procedure date:  12/08/2009  Findings:      IMPRESSION:   1.  Very small pulmonary nodules are stable from 01/09/2008.  The lack of interval change indicates benign etiology.  Based on current guidelines, no further imaging workup is necessary. 2.  Diffuse hepatic steatosis. 3.  Mild paraseptal emphysema.

## 2010-11-23 NOTE — Progress Notes (Signed)
Summary: wants med  Phone Note Call from Patient   Caller: Patient Call For: Stacie Glaze MD Summary of Call: wants to talk to you about Rx for reflux, pls call him. [facility] Initial call taken by: Raechel Ache, RN,  December 22, 2009 4:36 PM  Follow-up for Phone Call        nexium sent to [place] Follow-up by: Stacie Glaze MD,  December 23, 2009 9:23 AM    New/Updated Medications: NEXIUM 40 MG CPDR (ESOMEPRAZOLE MAGNESIUM) one by mouth   daily Prescriptions: NEXIUM 40 MG CPDR (ESOMEPRAZOLE MAGNESIUM) one by mouth   daily  #30 x 11   Entered and Authorized by:   Stacie Glaze MD   Signed by:   Stacie Glaze MD on 12/23/2009   Method used:   Electronically to        [facility]* (retail)       [address]       [place], [place]  [id number]       Ph: [phone]       Fax: [phone]   RxID:   [id number]

## 2010-12-28 ENCOUNTER — Other Ambulatory Visit (INDEPENDENT_AMBULATORY_CARE_PROVIDER_SITE_OTHER): Payer: BC Managed Care – PPO | Admitting: Internal Medicine

## 2010-12-28 DIAGNOSIS — E785 Hyperlipidemia, unspecified: Secondary | ICD-10-CM

## 2010-12-28 DIAGNOSIS — T887XXA Unspecified adverse effect of drug or medicament, initial encounter: Secondary | ICD-10-CM

## 2010-12-28 LAB — LIPID PANEL
Cholesterol: 201 mg/dL — ABNORMAL HIGH (ref 0–200)
HDL: 54.9 mg/dL (ref >39.00)
Total CHOL/HDL Ratio: 4
Triglycerides: 67 mg/dL (ref 0.0–149.0)
VLDL: 13.4 mg/dL (ref 0.0–40.0)

## 2010-12-28 LAB — HEPATIC FUNCTION PANEL
ALT: 23 U/L (ref 0–53)
AST: 20 U/L (ref 0–37)
Albumin: 4.2 g/dL (ref 3.5–5.2)
Alkaline Phosphatase: 55 U/L (ref 39–117)
Bilirubin, Direct: 0.2 mg/dL (ref 0.0–0.3)
Total Bilirubin: 1 mg/dL (ref 0.3–1.2)
Total Protein: 7 g/dL (ref 6.0–8.3)

## 2010-12-28 LAB — LDL CHOLESTEROL, DIRECT: Direct LDL: 125.3 mg/dL

## 2011-01-03 ENCOUNTER — Encounter: Payer: Self-pay | Admitting: Internal Medicine

## 2011-01-04 ENCOUNTER — Ambulatory Visit (INDEPENDENT_AMBULATORY_CARE_PROVIDER_SITE_OTHER): Payer: BC Managed Care – PPO | Admitting: Internal Medicine

## 2011-01-04 ENCOUNTER — Encounter: Payer: Self-pay | Admitting: Internal Medicine

## 2011-01-04 VITALS — BP 116/80 | HR 60 | Temp 98.0°F | Resp 14 | Ht 71.0 in | Wt 206.0 lb

## 2011-01-04 DIAGNOSIS — E785 Hyperlipidemia, unspecified: Secondary | ICD-10-CM

## 2011-01-04 DIAGNOSIS — Z0000 Encounter for general adult medical examination without abnormal findings: Secondary | ICD-10-CM

## 2011-01-04 MED ORDER — FENOFIBRATE 145 MG PO TABS: 145.0000 mg | ORAL_TABLET | Freq: Every day | ORAL | Status: DC

## 2011-01-04 NOTE — Progress Notes (Signed)
Subjective:    Patient ID: Eugene Harrington, male    DOB: October 30, 1945, 65 y.o.   MRN: [id number]  HPI patient is a 65 year old white male who presents for followup of hyperlipidemia having stopped his TriCor to see if we control his cholesterol with diet alone he has multiple cardiovascular risk factors including hyperlipidemia age and family history blood pressure is well controlled on at 116/80 he is afebrile he denies any chest pain shortness of breath PND orthopnea    Review of Systems  Constitutional: Negative for fever and fatigue.  HENT: Negative for hearing loss, congestion, neck pain and postnasal drip.   Eyes: Negative for discharge, redness and visual disturbance.  Respiratory: Negative for cough, shortness of breath and wheezing.   Cardiovascular: Negative for leg swelling.  Gastrointestinal: Negative for abdominal pain, constipation and abdominal distention.  Genitourinary: Negative for urgency and frequency.  Musculoskeletal: Negative for joint swelling and arthralgias.  Skin: Negative for color change and rash.  Neurological: Negative for weakness and light-headedness.  Hematological: Negative for adenopathy.  Psychiatric/Behavioral: Negative for behavioral problems.   Past Medical History  Diagnosis Date  . Unspecified adverse effect of unspecified drug, medicinal and biological substance   . Obstructive chronic bronchitis with acute bronchitis   . Impacted cerumen   . Acute frontal sinusitis   . 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 Malignant neoplasm of prostate   . Irritable bowel syndrome   . Other testicular hypofunction   . Hyperlipidemia   . GERD (gastroesophageal reflux disease)    Past Surgical History  Procedure Date  . Hiatal hernia repair   . Joint replacement   . Prostate surgery   . Tonsillectomy     reports that he quit smoking about 20 years ago. He does not have any smokeless tobacco history on  file. He reports that he does not drink alcohol or use illicit drugs. family history includes ALS in his brother and Cancer in his father and paternal grandfather. Allergies  Allergen Reactions  . Iohexol      Desc: PT developed itching on back of head post injection of 80cc's Omni. No hives. 50mg  PO Benedryl given., Onset Date: [id number]        Objective:   Physical Exam  Constitutional: He is oriented to person, place, and time. He appears well-developed and well-nourished.  HENT:  Head: Normocephalic and atraumatic.  Eyes: Conjunctivae are normal. Pupils are equal, round, and reactive to light.  Neck: Normal range of motion. Neck supple.  Cardiovascular: Normal rate and regular rhythm.   Pulmonary/Chest: Effort normal and breath sounds normal.  Abdominal: Soft. Bowel sounds are normal.  Musculoskeletal: Normal range of motion.  Neurological: He is alert and oriented to person, place, and time.  Skin: Skin is warm and dry.  Psychiatric: His behavior is normal. Thought content normal.          Assessment & Plan:   patient is a pleasant 65 year old male he presents for followup of hyperlipidemia having stopped his TriCor we reviewed the blood work and his total cholesterol has increased above 200 and his HDL as above 125 his goals are a total cholesterol less than 170 and LDL C. Less than 100 we discussed resuming the medication the importance of continuing the medication upwards factor reduction through achieving cholesterol goals the patient is willing to resume the medication were present for a physical this summer to monitor compliance.   other comorbid problems including his  COPD and erectile dysfunction and history of neoplasm of the prostate are stable he reports no problems he is breathing well he is compliant with his Advair inhaler

## 2011-03-09 NOTE — Op Note (Signed)
NAME:  Eugene Harrington, Eugene Harrington                     ACCOUNT NO.:  [id number]   MEDICAL RECORD NO.:  [id number]                   PATIENT TYPE:  INP   LOCATION:  0348                                 FACILITY:  [facility]   PHYSICIAN:  Sigmund I. Patsi Sears, M.D.         DATE OF BIRTH:  Feb 10, 1946   DATE OF PROCEDURE:  11/08/2003  DATE OF DISCHARGE:                                 OPERATIVE REPORT   PREOPERATIVE DIAGNOSIS:  Adenocarcinoma of the prostate.   POSTOPERATIVE DIAGNOSIS:  Adenocarcinoma of the prostate.   OPERATION:  Radical nerve-sparing retropubic prostatectomy.   SURGEON:  Sigmund I. Patsi Sears, M.D.   FIRST ASSISTANT:  Valetta Fuller, M.D.   SECOND ASSISTANT:  Susanne Borders, M.D.   PREPARATION:  After appropriate preanesthesia, the patient went to the  operating room, brought to the operating room, placed on the operating table  in the dorsal supine position, where general endotracheal anesthesia was  introduced.  A small pad was placed under the back, and the table was placed  in flexed position.  The abdomen was shaven, prepped with Betadine solution,  and draped in the usual fashion.   REVIEW OF HISTORY:  This 65 year old male has a history of a PSA of 2.61,  with free fraction of 17%.  Prostate biopsy showed Gleason 6 (3+3)  adenocarcinoma of the prostate in the left lateral lobe and in 5% of the  tissue.  The patient has a past history of irritable bowel syndrome, knee  surgery, hernia surgery, hypogonadism for which he has been treated with  AndroGel.  After a second opinion with radiation therapy, the patient  selected radical nerve-sparing prostatectomy as the treatment of choice.  He  is now for surgical intervention.   DESCRIPTION OF PROCEDURE:  A 12 cm midline abdominal incision was made from  the pubic tubercle proximalward under the umbilicus.  Subcutaneous tissue  was dissected with the electrosurgical unit in the midline.  The rectus  muscle was swept  bilaterally and the pelvic gutters identified bilaterally.  The self-retaining retractor was placed and following this, lymph node  dissection is accomplished bilaterally from the external iliac vein to the  pelvic sidewall, inferiorly to the obturator nerve, and then distally to the  femoral canal.  Each tissue sample is sent to the laboratory for permanent  examination.  No bleeding was noted.  The endopelvic fascia is then incised  bilaterally with attempt made to release the neurovascular bundle at that  level.  The dissection was carried up to the puboprostatic ligaments, which  were identified and cut.  Bands of levator muscle were incised with the  electrosurgical unit.  Two large veins that were identified bilaterally,  which were part of the dorsal vein complex, were clipped individually and  cut.   Using a McDougal retractor, dissection is made above the Foley catheter and  two separate #1 Vicryl sutures are placed.  Using a Stamey retractor, a  third #1 Vicryl  suture is placed most distalward and sutured in a figure-of-  eight fashion on the pubic wall.  Following this a back-bleeding suture of  #1 Vicryl was placed at the level of the bladder neck.  The Stamey traction  device is placed and a McDougal is placed above the urethra and using a 15  blade, incision is made of the dorsal vein complex.  Some bleeding was  noted, which required a single 3-0 Vicryl figure-of-eight suture to control.  Following this a right angle clamp was used to dissect the neurovascular  bundle bilaterally as it runs lateralward along the urethra.  A right angle  clamp was then placed behind the urethra and using a 15 blade, incision is  made over the urethra, exposing the Foley catheter.  The Foley catheter is  then cut, brought into the wound, and then the posterior portion of the  urethra is incised close to the bladder neck.  The cut Foley catheter was  then used as a traction device and following  this, the neurovascular bundle  of the prostate is bluntly and sharply dissected with clips and removed from  the prostate.  Excellent plane is identified between the seminal vesicles  and the neurovascular bundles.  The neurovascular bundles are dissected and  nicely fell away from the prostate.  The further neurovascular bundles are  clipped and cut.  Seminal vesicles are dissected, and the vas is identified  in the midline bilaterally with clips and incision.  Using the vas as a  guide, the seminal vesicles are then dissected proximalward.   Attention is then directed to the bladder neck.  Two Allis clamps are then  placed and using the Bovie, the bladder neck tissue was incised.  Using the  Vanderbilt, the bladder neck is then dissected, both distalward as well as  underneath the bladder neck.  The bladder neck is then incised, urine  drained.  Foley catheter is then brought into the wound and the bladder is  completely drained.  Indigo carmine is given and the ureteral orifices are  well back from the bladder neck.  Right angle clips are then placed in the  base of the seminal vesicles and the specimen is removed.  Minimal bleeding  is noted.  The blood loss in the case is 700 mL.   Vicryl 4-0 is then used to evert the bladder neck mucosa.  It was felt that  the bladder neck did not need any tightening.  Therefore, using a 24  dilator, five separate sutures were placed through the urethra and into the  bladder neck.  The anastomosis was then completed with a new Foley catheter  placed, with 20 mL in the balloon.  Irrigation revealed a watertight  anastomosis.  A separate Blake drain is placed, 10 flat, fully fluted, and  sutured in place with 3-0 nylon suture.   Closure:  The rectus muscle is reapproximated with 3-0 Vicryl suture, and  the fascia is closed with #1 running PDS suture.  The skin is closed with skin stapler.  The patient is awakened and taken to the recovery room in   good condition.                                               Sigmund I. Patsi Sears, M.D.    SIT/MEDQ  D:  11/08/2003  T:  11/08/2003  Job:  [id number]   cc:   Stacie Glaze, M.D. [facility]

## 2011-03-09 NOTE — Discharge Summary (Signed)
NAME:  Eugene Harrington, Eugene Harrington                     ACCOUNT NO.:  [id number]   MEDICAL RECORD NO.:  [id number]                   PATIENT TYPE:  INP   LOCATION:  0348                                 FACILITY:  [facility]   PHYSICIAN:  Sigmund I. Patsi Sears, M.D.         DATE OF BIRTH:  June 08, 1946   DATE OF ADMISSION:  11/08/2003  DATE OF DISCHARGE:                                 DISCHARGE SUMMARY   DISCHARGE DIAGNOSIS:  Prostate cancer.   HISTORY OF PRESENT ILLNESS:  This is a 65 year old man who was referred to  Dr. Patsi Sears for elevated PSA. His PSA jumped from 1.9 last year to 3 this  year. Prostate biopsy and prostate ultrasound were performed and he was  found to have a Gleason 6 (3+3) adenocarcinoma of the prostate in the left  lateral lobe in 5% of the tissue. The remaining biopsies were negative.  Treatment options were discussed  with the patient and he consulted Dr.  Kathrin Greathouse for radiation seed therapy, but then per Dr. Dan Humphreys and  Dr. Patsi Sears, the course of treatment was preferred to be radical  prostatectomy. The patient was taken to [facility] where radical  prostatectomy was performed.   PAST MEDICAL HISTORY:  1. Irritable bowel syndrome.  2. Hypergonadism.  3. Erectile dysfunction.   HOSPITAL COURSE:  The patient was taken to the operating room where a  radical prostatectomy was performed, see operative noted. He had a normal  postoperative course afterwards.   On postoperative day #1 the patient was ambulating and had only about 10 to  20 mL of blood draining from his Jackson-Pratt drain. On postoperative day  #2 the Jackson-Pratt drain was discontinued due to a small amount of  drainage down to 8 mL. The patient's pain was well controlled on p.o.  medication and he was ambulating and having no difficulties. His catheter is  intact and the urine is beginning to clear.   On postoperative day #2 the patient was given Dulcolax suppository and the  patient has had positive flatus and positive bowel movement. He states that  he is ready to go home.   DISCHARGE MEDICATIONS:  1. Cipro 1 p.o. b.i.d. for the next 7 days.  2. Percocet  for pain.  3. Advise the patient to get over-the-counter Dulcolax or Colace due to     constipating effects of pain medication.  4. Resume all home medications.   PLAN:  The patient will return to see me in approximately 1 week to have his  surgical staples removed. He will then return approximately 2 weeks after  his surgical date to have his Foley catheter removed after a voiding trial.   CONDITION ON DISCHARGE:  Stable.     Terri Piedra, N.P.                         Sigmund I. Patsi Sears, M.D.    HB/MEDQ  D:  11/10/2003  T:  11/10/2003  Job:  [id number]

## 2011-03-09 NOTE — H&P (Signed)
NAME:  Eugene Harrington, Eugene Harrington                     ACCOUNT NO.:  [id number]   MEDICAL RECORD NO.:  [id number]                   PATIENT TYPE:  INP   LOCATION:  X001                                 FACILITY:  [facility]   PHYSICIAN:  Sigmund I. Patsi Sears, M.D.         DATE OF BIRTH:  1945-11-04   DATE OF ADMISSION:  11/08/2003  DATE OF DISCHARGE:                                HISTORY & PHYSICAL   HISTORY OF PRESENT ILLNESS:  This is a 65 year old man who was referred to  Dr. Patsi Sears for PSA elevation.  PSA was 1.9 last year and jumped to 3  this year.  PSA was re-drawn with the result of 2.61 and free fraction of  17.2%.  He underwent prostate ultrasound and prostate biopsy with the  finding of a Gleason 6 (3+3) adenocarcinoma of the prostate in the left  lateral lobe in 5% of the tissue.  Remaining biopsies were negative.  All  options for treatment were discussed with the patient, including radical  prostatectomy.  He was then consulted for radioactive seed therapy by Dr.  Kathrin Greathouse.  Dr. Dan Humphreys then re-referred him back to Dr. Patsi Sears  for radical prostatectomy.  The patient understands the nature of the  surgery, complications, and is in agreement with the procedure to remove the  cancer.   PAST MEDICAL HISTORY:  1. IBS.  2. Hypogonadism.  3. Erectile dysfunction.   PAST SURGICAL HISTORY:  1. Total knee replacement in 1997.  2. Hernia repair in 1989.   SOCIAL HISTORY:  The patient is divorced.  He has one child.  He smoked two  packs per day x28 years.  He quit in 1993.  He denies any alcohol use.   MEDICATIONS:  1. Cialis 20 mg p.r.n.  2. Testim 1% daily.  3. Aspirin.  4. Melatonin p.r.n.  5. Multivitamin.   ALLERGIES:  No known drug allergies.   REVIEW OF SYSTEMS:  Significant for erectile dysfunction and hypogonadism.   PHYSICAL EXAMINATION:  GENERAL:  A 65 year old, well-developed, well-  nourished white male in no acute distress.  HEENT:  Normocephalic,  no lymphadenopathy noted.  CARDIAC:  Regular rate and rhythm.  No murmurs, rubs, or gallops noted.  RESPIRATORY:  Clear to auscultation bilaterally.  GASTROINTESTINAL:  Soft, nontender, positive bowel sounds.  GENITOURINARY:  3+ lobular benign prostate.  NEUROLOGIC:  Grossly intact.  EXTREMITIES:  No edema noted.   LABORATORY DATA:  X-ray/imaging:  CT scan and bone scan show no evidence of  metastatic disease.  Refer to HPI for biopsy findings.   ASSESSMENT:  Prostate cancer.   PLAN:  Take the patient to [facility] for a radical prostatectomy  by Dr. Patsi Sears.     Terri Piedra, N.P.                         Sigmund I. Patsi Sears, M.D.    HB/MEDQ  D:  11/08/2003  T:  11/08/2003  Job:  [id number]   cc:   Stacie Glaze, M.D. [facility]

## 2011-03-09 NOTE — Assessment & Plan Note (Signed)
[facility]                            CARDIOLOGY OFFICE NOTE   NAME:HUTCHINGSJoal, Harrington                    MRN:          [id number]  DATE:11/14/2006                            DOB:          19-Nov-1945    [name] is a delightful 65 year old married white male who  has had about 6 months' history of chest tightness with exertion and  shortness of breath.  He also complains of significant fatigue and just  not feeling well.   He has multiple cardiac risk factors including age, male sex, heavy  tobacco use until 22, 2 packs per day for 24 years, hyperlipidemia, on  treatment.   PAST MEDICAL HISTORY:  Significant for no known drug allergies.   CURRENT MEDICATIONS:  1. Testim cream daily.  2. Cialis 5 mg daily p.r.n.  3. Zyrtec 10 mg a day.  4. Advicor 500 mg a day.  5. Aspirin 81 mg a day.  6. Prilosec daily.  7. Wellbutrin XL 150 mg a day.   He has no dye allergy.  There are no other drug allergies.   He does not drink.  He does not use any recreational drugs.  He drinks  about 8-10 cups of coffee a day.   SURGICAL HISTORY:  1. He had a hernia repair in 1989.  2. Knee replacement in 1997.  3. Prostate cancer surgery in 2005.   He has no premature history of coronary disease.   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  He lives in [place], [place].  He is  married, has 1 child.   REVIEW OF SYSTEMS:  Other than the HPI, is really negative.  He does  have a history of some gastroesophageal reflux.  He has a history of a  hiatal hernia.  He has a history of some depression.   PHYSICAL EXAMINATION:  A very pleasant gentleman, no acute distress.  He  is an excellent historian.  His blood pressure is 121/88, pulse is 69 and regular.  He is 5 feet 11  inches, weighs 234 pounds.  He has a ruddy complexion.  HEENT:  Normocephalic, atraumatic. PERRLA.  Extraocular  movements  intact.  Sclerae clear.  Facial symmetry is normal.  Dentition is  satisfactory.  Carotid upstrokes are equal bilaterally without bruits.  There is no  JVD.  Thyroid is not enlarged, trachea is midline.  LUNGS:  Clear to auscultation and percussion.  HEART:  Reveals a nondisplaced PMI.  Normal S1, S2, without murmur, rub,  or gallop.  ABDOMINAL EXAM:  Protuberant, good bowel sounds.  No midline tenderness  or epigastric tenderness.  There is no organomegaly, there is no upper  quadrant tenderness.  EXTREMITIES:  Reveal no cyanosis, clubbing, or edema.  Pulses are  intact.  NEUROLOGICAL EXAM:  Totally intact.  MUSCULOSKELETAL:  Intact.  SKIN:  Within normal limits.   EKG shows normal sinus rhythm with a left anterior fascicular block.   ASSESSMENT/PLAN:  I have had a long talk with Eugene Harrington.  He clearly  has risk  factors, and he has worrisome symptomatology.  However, there  are some atypical features to it as well.   He was actually sent over here for an exercise treadmill.  We  intercepted this and set him up for an exercise Myoview.  Will plan on  doing this on Monday, January the 28th.   If he has any significant ischemia, he will need a heart  catheterization.  He understands the game plan and agrees to proceed.     Eugene C. Daleen Squibb, MD, [facility]  Electronically Signed    TCW/MedQ  DD: 11/14/2006  DT: 11/14/2006  Job #: [id number]   cc:   Eugene Glaze, MD

## 2011-03-27 ENCOUNTER — Telehealth: Payer: Self-pay | Admitting: Internal Medicine

## 2011-03-27 MED ORDER — PANTOPRAZOLE SODIUM 40 MG PO TBEC: 40.0000 mg | DELAYED_RELEASE_TABLET | Freq: Every day | ORAL | Status: DC

## 2011-03-27 MED ORDER — FENOFIBRATE 145 MG PO TABS: 145.0000 mg | ORAL_TABLET | Freq: Every day | ORAL | Status: DC

## 2011-03-27 NOTE — Telephone Encounter (Signed)
done

## 2011-03-27 NOTE — Telephone Encounter (Signed)
Pt is changed pharmacies and would like refill of Tricor 145 mg and Pantoprazole 40 mg to be called in to [facility] in [place], [place] [phone]

## 2011-07-06 ENCOUNTER — Other Ambulatory Visit: Payer: BC Managed Care – PPO

## 2011-07-13 ENCOUNTER — Encounter: Payer: BC Managed Care – PPO | Admitting: Internal Medicine

## 2011-07-23 ENCOUNTER — Other Ambulatory Visit (INDEPENDENT_AMBULATORY_CARE_PROVIDER_SITE_OTHER): Payer: Medicare Other

## 2011-07-23 DIAGNOSIS — E785 Hyperlipidemia, unspecified: Secondary | ICD-10-CM

## 2011-07-23 DIAGNOSIS — Z0000 Encounter for general adult medical examination without abnormal findings: Secondary | ICD-10-CM

## 2011-07-23 DIAGNOSIS — K7689 Other specified diseases of liver: Secondary | ICD-10-CM

## 2011-07-23 DIAGNOSIS — T887XXA Unspecified adverse effect of drug or medicament, initial encounter: Secondary | ICD-10-CM

## 2011-07-23 DIAGNOSIS — E291 Testicular hypofunction: Secondary | ICD-10-CM

## 2011-07-23 LAB — POCT URINALYSIS DIPSTICK
Bilirubin, UA: NEGATIVE
Blood, UA: NEGATIVE
Glucose, UA: NEGATIVE
Ketones, UA: NEGATIVE
Nitrite, UA: NEGATIVE
Spec Grav, UA: 1.03
pH, UA: 5.5

## 2011-07-23 LAB — HEPATIC FUNCTION PANEL
ALT: 36 U/L (ref 0–53)
AST: 40 U/L — ABNORMAL HIGH (ref 0–37)
Alkaline Phosphatase: 41 U/L (ref 39–117)
Bilirubin, Direct: 0.2 mg/dL (ref 0.0–0.3)
Total Bilirubin: 0.8 mg/dL (ref 0.3–1.2)
Total Protein: 6.6 g/dL (ref 6.0–8.3)

## 2011-07-23 LAB — BASIC METABOLIC PANEL
BUN: 20 mg/dL (ref 6–23)
CO2: 28 mEq/L (ref 19–32)
Calcium: 8.9 mg/dL (ref 8.4–10.5)
GFR: 65.49 mL/min (ref >60.00)
Glucose, Bld: 98 mg/dL (ref 70–99)
Potassium: 4.8 mEq/L (ref 3.5–5.1)

## 2011-07-23 LAB — PSA: PSA: 0 ng/mL — ABNORMAL LOW (ref 0.10–4.00)

## 2011-07-23 LAB — CBC WITH DIFFERENTIAL/PLATELET
Basophils Absolute: 0 10*3/uL (ref 0.0–0.1)
Eosinophils Absolute: 0.2 10*3/uL (ref 0.0–0.7)
HCT: 44.8 % (ref 39.0–52.0)
Hemoglobin: 14.7 g/dL (ref 13.0–17.0)
Lymphs Abs: 1.7 10*3/uL (ref 0.7–4.0)
MCHC: 32.8 g/dL (ref 30.0–36.0)
Monocytes Relative: 8.9 % (ref 3.0–12.0)
Neutro Abs: 2.4 10*3/uL (ref 1.4–7.7)
Platelets: 185 10*3/uL (ref 150.0–400.0)
RDW: 13.9 % (ref 11.5–14.6)

## 2011-07-23 LAB — LIPID PANEL
Cholesterol: 150 mg/dL (ref 0–200)
VLDL: 9.2 mg/dL (ref 0.0–40.0)

## 2011-09-19 ENCOUNTER — Telehealth: Payer: Self-pay | Admitting: *Deleted

## 2011-09-19 DIAGNOSIS — R05 Cough: Secondary | ICD-10-CM

## 2011-09-19 NOTE — Telephone Encounter (Signed)
X-ray ordered Left message on machine for patient

## 2011-09-19 NOTE — Telephone Encounter (Signed)
patient  Is calling because [place] was [place] bronch[place] the [place].  He was given medication and it was suggested that he have a chest x-ray.  He is still not well and would like to have a chest x-ray and possibly a new rx.

## 2011-09-19 NOTE — Telephone Encounter (Signed)
Per dr Lovell Sheehan- may have cxr

## 2011-09-20 ENCOUNTER — Telehealth: Payer: Self-pay | Admitting: Internal Medicine

## 2011-09-20 MED ORDER — HYDROCODONE-HOMATROPINE 5-1.5 MG PO TABS: 1.0000 | ORAL_TABLET | Freq: Four times a day (QID) | ORAL | Status: DC | PRN

## 2011-09-20 NOTE — Telephone Encounter (Signed)
Per dr Lovell Sheehan- may have hycodan 6oz 1 tsp every 6-8 hours prn

## 2011-09-20 NOTE — Telephone Encounter (Signed)
rx called in and patient is aware 

## 2011-09-20 NOTE — Telephone Encounter (Signed)
Pt wants [place] to keep eye out for xray- and requests cough meds now- has bronchitis

## 2011-09-20 NOTE — Telephone Encounter (Signed)
Pt called req to get cxr results. Results should have been from [facility] yesterday. Pls call today.

## 2011-09-28 ENCOUNTER — Ambulatory Visit (INDEPENDENT_AMBULATORY_CARE_PROVIDER_SITE_OTHER): Payer: Medicare Other | Admitting: Internal Medicine

## 2011-09-28 ENCOUNTER — Encounter: Payer: Self-pay | Admitting: Internal Medicine

## 2011-09-28 DIAGNOSIS — J40 Bronchitis, not specified as acute or chronic: Secondary | ICD-10-CM

## 2011-09-28 DIAGNOSIS — R05 Cough: Secondary | ICD-10-CM

## 2011-09-28 DIAGNOSIS — J449 Chronic obstructive pulmonary disease, unspecified: Secondary | ICD-10-CM

## 2011-09-28 DIAGNOSIS — Z0000 Encounter for general adult medical examination without abnormal findings: Secondary | ICD-10-CM

## 2011-09-28 MED ORDER — DOXYCYCLINE HYCLATE 100 MG PO TABS: 100.0000 mg | ORAL_TABLET | Freq: Two times a day (BID) | ORAL | Status: AC

## 2011-09-28 MED ORDER — BENZONATATE 200 MG PO CAPS: 200.0000 mg | ORAL_CAPSULE | Freq: Three times a day (TID) | ORAL | Status: AC | PRN

## 2011-09-28 MED ORDER — PREDNISONE 20 MG PO TABS: 20.0000 mg | ORAL_TABLET | Freq: Every day | ORAL | Status: AC

## 2011-09-28 MED ORDER — PREDNISONE 20 MG PO TABS: 20.0000 mg | ORAL_TABLET | Freq: Every day | ORAL | Status: DC

## 2011-09-28 MED ORDER — DOXYCYCLINE HYCLATE 100 MG PO TABS: 100.0000 mg | ORAL_TABLET | Freq: Two times a day (BID) | ORAL | Status: DC

## 2011-09-28 MED ORDER — BENZONATATE 200 MG PO CAPS: 200.0000 mg | ORAL_CAPSULE | Freq: Three times a day (TID) | ORAL | Status: DC | PRN

## 2011-09-28 NOTE — Patient Instructions (Signed)
The patient is instructed to continue all medications as prescribed. Schedule followup with check out clerk upon leaving the clinic  

## 2011-09-28 NOTE — Progress Notes (Signed)
Subjective:    Patient ID: Eugene Harrington, male    DOB: 04-Mar-1946, 65 y.o.   MRN: [id number]  HPI Patient presents for Medicare wellness examination.  He was also seen.  for a chronic cough.  The cough began in October of this year with upper respiratory type symptoms he was treated in urgent care with a Z-Pak and cough medications.  The symptoms did not improve he took a trip to [place] where his symptoms worsened he was again seen in urgent care setting given a second course of azithromycin and a third-generation cephalosporin for 7 days.  The patient was given an inhaler  A chest x-ray was obtained on the 31st of November and the chest x-ray shows a questionable opacity in the left lower lobe. The patient has several CT images 2009 2010 and 2011 following small right pulmonary nodules that remain unchanged.  The patient has a persistent nonproductive hacking irritating cough he cannot sit still for any length of time without this cough.  The patient's past medical history is significant for hyperlipidemia gastroesophageal reflux in early stage COPD     Review of Systems  Constitutional: Negative for fever and fatigue.  HENT: Negative for hearing loss, congestion, neck pain and postnasal drip.   Eyes: Negative for discharge, redness and visual disturbance.  Respiratory: Positive for cough and shortness of breath. Negative for wheezing.   Cardiovascular: Negative for leg swelling.  Gastrointestinal: Negative for abdominal pain, constipation and abdominal distention.  Genitourinary: Negative for urgency and frequency.  Musculoskeletal: Negative for joint swelling and arthralgias.  Skin: Negative for color change and rash.  Neurological: Negative for weakness and light-headedness.  Hematological: Negative for adenopathy.  Psychiatric/Behavioral: Negative for behavioral problems.   Past Medical History  Diagnosis Date  . Unspecified adverse effect of unspecified drug,  medicinal and biological substance   . Obstructive chronic bronchitis with acute bronchitis   . Impacted cerumen   . Acute frontal sinusitis   . 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 Recurrent prostate adenocarcinoma   . Irritable bowel syndrome   . Other testicular hypofunction   . Hyperlipidemia   . GERD (gastroesophageal reflux disease)     History   Social History  . Marital Status: Married    Spouse Name: N/A    Number of Children: N/A  . Years of Education: N/A   Occupational History  . Not on file.   Social History Main Topics  . Smoking status: Former Smoker    Quit date: 10/22/1990  . Smokeless tobacco: Not on file  . Alcohol Use: No  . Drug Use: No  . Sexually Active: Not on file   Other Topics Concern  . Not on file   Social History Narrative  . No narrative on file    Past Surgical History  Procedure Date  . Hiatal hernia repair   . Joint replacement   . Prostate surgery   . Tonsillectomy     Family History  Problem Relation Age of Onset  . Cancer Father   . ALS Brother   . Cancer Paternal Grandfather     Allergies  Allergen Reactions  . Iohexol      Desc: PT developed itching on back of head post injection of 80cc's Omni. No hives. 50mg  PO Benedryl given., Onset Date: [id number]     Current Outpatient Prescriptions on File Prior to Visit  Medication Sig Dispense Refill  . aspirin 81 MG tablet Take  81 mg by mouth daily.        . fenofibrate (TRICOR) 145 MG tablet Take 1 tablet (145 mg total) by mouth daily.  90 tablet  3  . Fluticasone-Salmeterol (ADVAIR DISKUS) 250-50 MCG/DOSE AEPB Inhale 1 puff into the lungs daily.        . Hydrocodone-Homatropine 5-1.5 MG TABS Take 1 tablet by mouth every 6 (six) hours as needed.  6 each  0  . pantoprazole (PROTONIX) 40 MG tablet Take 1 tablet (40 mg total) by mouth daily.  90 tablet  3  . tadalafil (CIALIS) 20 MG tablet Take 20 mg by mouth daily as needed.           BP 130/82  Pulse 72  Temp 98.3 F (36.8 C)  Resp 16  Ht 5\' 11"  (1.803 m)  Wt 234 lb (106.142 kg)  BMI 32.64 kg/m2       Objective:   Physical Exam  Nursing note and vitals reviewed. Constitutional: He is oriented to person, place, and time. He appears well-developed and well-nourished.  HENT:  Head: Normocephalic and atraumatic.  Eyes: Conjunctivae are normal. Pupils are equal, round, and reactive to light.  Neck: Normal range of motion. Neck supple.  Cardiovascular: Normal rate and regular rhythm.   Pulmonary/Chest: Effort normal. He has wheezes.  Abdominal: Soft. Bowel sounds are normal.  Genitourinary: Rectum normal and prostate normal.  Musculoskeletal: Normal range of motion.  Neurological: He is alert and oriented to person, place, and time.  Skin: Skin is warm and dry.          Assessment & Plan:  I believe that if he does not respond to the intervention plan for this chronic cough that we would refer him to pulmonary for further evaluation.  In the interim I propose that we approach this from 3 directions due to the possibility of a chronic inflammatory component we should use an oral steroid burst and taper.  We will also use a cough extinguishing of medication such as Tessalon Perles that he can use consistently throughout the day without fear of sedation.  I would use doxycycline 100 mg twice daily and believe that he has been adequately treated for most community-acquired pneumonias with 2 rounds of azithromycin and the Omnicef.  Next  May include pulmonary critical care referral and a CT of the chest. I do recommend continued use of a inhaler but would avoid the combination inhaler and use Advair instead.  Differential diagnosis GERD, post infectious cough syndrome, cough-induced asthma and neoplasm which is less likely due to the negative studies but must always be a consideration   Subjective:    Eugene Harrington is a 65 y.o. male who  presents for Medicare Annual/Subsequent preventive examination.   Preventive Screening-Counseling & Management  Tobacco History  Smoking status  . Former Smoker  . Quit date: 10/22/1990  Smokeless tobacco  . Not on file    Problems Prior to Visit 1.   Current Problems (verified) Patient Active Problem List  Diagnoses  . NEOP, MALIGNANT, PROSTATE  . TESTOSTERONE DEFICIENCY  . HYPERLIPIDEMIA  . CERUMEN IMPACTION, BILATERAL  . ACUTE FRONTAL SINUSITIS  . ALLERGIC RHINITIS  . CHRONIC OBSTRUCTIVE ASTHMA WITH EXACERBATION  . PULMONARY NODULE, RIGHT LOWER LOBE  . GERD  . IRRITABLE BOWEL SYNDROME  . FATTY LIVER DISEASE  . ACTINIC KERATOSIS, FOREHEAD, LEFT  . OSTEOARTHROSIS, LOCAL NOS, LOWER LEG  . HEEL PAIN  . SHORTNESS OF BREATH  . UNS ADVRS EFF UNS  RX MEDICINAL&BIOLOGICAL SBSTNC    Medications Prior to Visit Current Outpatient Prescriptions on File Prior to Visit  Medication Sig Dispense Refill  . aspirin 81 MG tablet Take 81 mg by mouth daily.        . fenofibrate (TRICOR) 145 MG tablet Take 1 tablet (145 mg total) by mouth daily.  90 tablet  3  . Fluticasone-Salmeterol (ADVAIR DISKUS) 250-50 MCG/DOSE AEPB Inhale 1 puff into the lungs daily.        . Hydrocodone-Homatropine 5-1.5 MG TABS Take 1 tablet by mouth every 6 (six) hours as needed.  6 each  0  . pantoprazole (PROTONIX) 40 MG tablet Take 1 tablet (40 mg total) by mouth daily.  90 tablet  3  . tadalafil (CIALIS) 20 MG tablet Take 20 mg by mouth daily as needed.          Current Medications (verified) Current Outpatient Prescriptions  Medication Sig Dispense Refill  . aspirin 81 MG tablet Take 81 mg by mouth daily.        . fenofibrate (TRICOR) 145 MG tablet Take 1 tablet (145 mg total) by mouth daily.  90 tablet  3  . Fluticasone-Salmeterol (ADVAIR DISKUS) 250-50 MCG/DOSE AEPB Inhale 1 puff into the lungs daily.        . Hydrocodone-Homatropine 5-1.5 MG TABS Take 1 tablet by mouth every 6 (six) hours as needed.   6 each  0  . pantoprazole (PROTONIX) 40 MG tablet Take 1 tablet (40 mg total) by mouth daily.  90 tablet  3  . tadalafil (CIALIS) 20 MG tablet Take 20 mg by mouth daily as needed.        . Testosterone 30 MG/ACT SOLN Place onto the skin daily.           Allergies (verified) Iohexol   PAST HISTORY  Family History Family History  Problem Relation Age of Onset  . Cancer Father   . ALS Brother   . Cancer Paternal Grandfather     Social History History  Substance Use Topics  . Smoking status: Former Smoker    Quit date: 10/22/1990  . Smokeless tobacco: Not on file  . Alcohol Use: No    Are there smokers in your home (other than you)?  No  Risk Factors Current exercise habits: Gym/ health club routine includes cardio and walking on track .  Dietary issues discussed: none   Cardiac risk factors: advanced age (older than 19 for men, 42 for women), dyslipidemia and male gender.  Depression Screen (Note: if answer to either of the following is "Yes", a more complete depression screening is indicated)   Q1: Over the past two weeks, have you felt down, depressed or hopeless? No  Q2: Over the past two weeks, have you felt little interest or pleasure in doing things? No  Have you lost interest or pleasure in daily life? No  Do you often feel hopeless? No  Do you cry easily over simple problems? No  Activities of Daily Living In your present state of health, do you have any difficulty performing the following activities?:  Driving? No Managing money?  No Feeding yourself? No Getting from bed to chair? No Climbing a flight of stairs? No Preparing food and eating?: no Bathing or showering? No Getting dressed: No Getting to the toilet? No Using the toilet:No Moving around from place to place: No In the past year have you fallen or had a near fall?:No   Are you sexually active?  No  Do you have more than one partner?  No  Hearing Difficulties: No Do you often ask people to  speak up or repeat themselves? No Do you experience ringing or noises in your ears? No Do you have difficulty understanding soft or whispered voices? No   Do you feel that you have a problem with memory? No  Do you often misplace items? No  Do you feel safe at home?  yes  Cognitive Testing  Alert? Yes  Normal Appearance?yes  Oriented to person? Yes  Place? Yes   Time? Yes  Recall of three objects?  Yes  Can perform simple calculations? Yes  Displays appropriate judgment?Yes  Can read the correct time from a watch face?Yes   Advanced Directives have been discussed with the patient? Yes   List the Names of Other Physician/Practitioners you currently use: 1.  [facility]  Indicate any recent Medical Services you may have received from other than [facility] providers in the past year (date may be approximate).  Immunization History  Administered Date(s) Administered  . Pneumococcal Polysaccharide 12/31/2008  . Td 12/23/2001, 12/31/2008    Screening Tests Health Maintenance  Topic Date Due  . Colonoscopy  10/18/1994  . Zostavax  10/18/2004  . Pneumococcal Polysaccharide Vaccine Age 87 And Over  10/18/2009  . Influenza Vaccine  07/23/2011  . Tetanus/tdap  01/01/2019    All answers were reviewed with the patient and necessary referrals were made:  Carrie Mew   09/28/2011   History reviewed: allergies, current medications, past family history, past medical history, past social history, past surgical history and problem list  Review of Systems A comprehensive review of systems was negative except for: Respiratory: positive for cough, dyspnea on exertion, pleurisy/chest pain and wheezing    Objective:     Vision by Snellen chart: right eye:20/20, left eye:20/20 Blood pressure 130/82, pulse 72, temperature 98.3 F (36.8 C), resp. rate 16, height 5\' 11"  (1.803 m), weight 234 lb (106.142 kg). Body mass index is 32.64 kg/(m^2).  BP 130/82  Pulse 72  Temp 98.3 F  (36.8 C)  Resp 16  Ht 5\' 11"  (1.803 m)  Wt 234 lb (106.142 kg)  BMI 32.64 kg/m2  General Appearance:    Alert, cooperative, no distress, appears stated age  Head:    Normocephalic, without obvious abnormality, atraumatic  Eyes:    PERRL, conjunctiva/corneas clear, EOM's intact, fundi    benign, both eyes       Ears:    Normal TM's and external ear canals, both ears  Nose:   Nares normal, septum midline, mucosa normal, no drainage    or sinus tenderness  Throat:   Lips, mucosa, and tongue normal; teeth and gums normal  Neck:   Supple, symmetrical, trachea midline, no adenopathy;       thyroid:  No enlargement/tenderness/nodules; no carotid   bruit or JVD  Back:     Symmetric, no curvature, ROM normal, no CVA tenderness  Lungs:     Clear to auscultation bilaterally, respirations unlabored  Chest wall:    No tenderness or deformity  Heart:    Regular rate and rhythm, S1 and S2 normal, no murmur, rub   or gallop  Abdomen:     Soft, non-tender, bowel sounds active all four quadrants,    no masses, no organomegaly  Genitalia:    Normal male without lesion, discharge or tenderness  Rectal:    Normal tone, normal prostate, no masses or tenderness;   guaiac negative stool  Extremities:   Extremities normal, atraumatic, no cyanosis or edema  Pulses:   2+ and symmetric all extremities  Skin:   Skin color, texture, turgor normal, no rashes or lesions  Lymph nodes:   Cervical, supraclavicular, and axillary nodes normal  Neurologic:   CNII-XII intact. Normal strength, sensation and reflexes      throughout       Assessment:     Patient presents for yearly preventative medicine examination.   all immunizations and health maintenance protocols were reviewed with the patient and they are up to date with these protocols.   screening laboratory values were reviewed with the patient including screening of hyperlipidemia PSA renal function and hepatic function.   There medications past medical  history social history problem list and allergies were reviewed in detail.   Goals were established with regard to weight loss exercise diet in compliance with medications     Plan:     During the course of the visit the patient was educated and counseled about appropriate screening and preventive services including:    Influenza vaccine  Diet review for nutrition referral? Yes ____  Not Indicated x   Patient Instructions (the written plan) was given to the patient.  Medicare Attestation I have personally reviewed: The patient's medical and social history Their use of alcohol, tobacco or illicit drugs Their current medications and supplements The patient's functional ability including ADLs,fall risks, home safety risks, cognitive, and hearing and visual impairment Diet and physical activities Evidence for depression or mood disorders  The patient's weight, height, BMI, and visual acuity have been recorded in the chart.  I have made referrals, counseling, and provided education to the patient based on review of the above and I have provided the patient with a written personalized care plan for preventive services.     Carrie Mew   09/28/2011

## 2011-10-09 ENCOUNTER — Telehealth: Payer: Self-pay | Admitting: Internal Medicine

## 2011-10-09 NOTE — Telephone Encounter (Signed)
Pt called and wanted to give Dr Lovell Sheehan an update on pts progress. Pt is still not well. Pt is still coughing and congested. Cough has improved some, but pt is still sick. What would Dr Lovell Sheehan recommend?

## 2011-10-09 NOTE — Telephone Encounter (Signed)
Pt informed and would like to go to pulmonary md in [place]- pt will call back with name

## 2011-10-09 NOTE — Telephone Encounter (Signed)
Per dr Lovell Sheehan- please give pulmonary referral

## 2011-10-10 ENCOUNTER — Telehealth: Payer: Self-pay | Admitting: Internal Medicine

## 2011-10-10 DIAGNOSIS — R05 Cough: Secondary | ICD-10-CM

## 2011-10-10 NOTE — Telephone Encounter (Signed)
Referral sent to [facility] to the m d pt requested

## 2011-10-10 NOTE — Telephone Encounter (Signed)
He would like to see Ozella Rocks, md, [facility]---[phone]. Please refer.

## 2011-10-10 NOTE — Telephone Encounter (Signed)
Sent referral to [facility]

## 2011-10-22 ENCOUNTER — Telehealth: Payer: Self-pay | Admitting: Internal Medicine

## 2011-10-24 ENCOUNTER — Ambulatory Visit (INDEPENDENT_AMBULATORY_CARE_PROVIDER_SITE_OTHER): Payer: Medicare Other | Admitting: Family

## 2011-10-24 ENCOUNTER — Encounter: Payer: Self-pay | Admitting: Family

## 2011-10-24 VITALS — BP 110/80 | HR 113 | Temp 98.5°F | Wt 233.0 lb

## 2011-10-24 DIAGNOSIS — R059 Cough, unspecified: Secondary | ICD-10-CM

## 2011-10-24 DIAGNOSIS — R05 Cough: Secondary | ICD-10-CM

## 2011-10-24 DIAGNOSIS — R0602 Shortness of breath: Secondary | ICD-10-CM

## 2011-10-24 DIAGNOSIS — J449 Chronic obstructive pulmonary disease, unspecified: Secondary | ICD-10-CM

## 2011-10-24 DIAGNOSIS — J4489 Other specified chronic obstructive pulmonary disease: Secondary | ICD-10-CM

## 2011-10-24 DIAGNOSIS — K219 Gastro-esophageal reflux disease without esophagitis: Secondary | ICD-10-CM

## 2011-10-24 MED ORDER — PREDNISONE 20 MG PO TABS: ORAL_TABLET | ORAL | Status: AC

## 2011-10-24 MED ORDER — HYDROCOD POLST-CHLORPHEN POLST 10-8 MG/5ML PO LQCR: 5.0000 mL | Freq: Two times a day (BID) | ORAL | Status: DC

## 2011-10-24 NOTE — Progress Notes (Signed)
Subjective:    Patient ID: Eugene Harrington, male    DOB: 06/14/1946, 66 y.o.   MRN: [id number]  HPI 66 year old white male, patient of Dr. Lovell Sheehan, former smoker is in today with a chronic cough. He seen Dr. Lovell Sheehan for multiple occasions for the cough and now has an appointment with pulmonology. Describes the cough is productive, with a large amount of phlegm. In December he was treated for acute bronchitis given prednisone and a Z-Pak and he believes it helps to improve his symptoms. He admits to wheezing. Patient also complains of shortness of breath on exertion. Denies any chest pain. Dr. Lovell Sheehan has also changed his GERD medication regimen that has not helped his symptoms. He continues to use his Advair inhaler.   Review of Systems  Constitutional: Negative.   HENT: Negative.   Respiratory: Positive for cough.   Cardiovascular: Negative.   Gastrointestinal: Negative.   Musculoskeletal: Negative.   Skin: Negative.   Neurological: Negative.   Hematological: Negative.   Psychiatric/Behavioral: Negative.        Past Medical History  Diagnosis Date  . Unspecified adverse effect of unspecified drug, medicinal and biological substance   . Obstructive chronic bronchitis with acute bronchitis   . Impacted cerumen   . Acute frontal sinusitis   . 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 Recurrent prostate adenocarcinoma   . Irritable bowel syndrome   . Other testicular hypofunction   . Hyperlipidemia   . GERD (gastroesophageal reflux disease)     History   Social History  . Marital Status: Married    Spouse Name: N/A    Number of Children: N/A  . Years of Education: N/A   Occupational History  . Not on file.   Social History Main Topics  . Smoking status: Former Smoker    Quit date: 10/22/1990  . Smokeless tobacco: Not on file  . Alcohol Use: No  . Drug Use: No  . Sexually Active: Not on file   Other Topics Concern  .  Not on file   Social History Narrative  . No narrative on file    Past Surgical History  Procedure Date  . Hiatal hernia repair   . Joint replacement   . Prostate surgery   . Tonsillectomy     Family History  Problem Relation Age of Onset  . Cancer Father   . ALS Brother   . Cancer Paternal Grandfather     Allergies  Allergen Reactions  . Iohexol      Desc: PT developed itching on back of head post injection of 80cc's Omni. No hives. 50mg  PO Benedryl given., Onset Date: [id number]     Current Outpatient Prescriptions on File Prior to Visit  Medication Sig Dispense Refill  . aspirin 81 MG tablet Take 81 mg by mouth daily.        . fenofibrate (TRICOR) 145 MG tablet Take 1 tablet (145 mg total) by mouth daily.  90 tablet  3  . Fluticasone-Salmeterol (ADVAIR DISKUS) 250-50 MCG/DOSE AEPB Inhale 1 puff into the lungs daily.        . Hydrocodone-Homatropine 5-1.5 MG TABS Take 1 tablet by mouth every 6 (six) hours as needed.  6 each  0  . pantoprazole (PROTONIX) 40 MG tablet Take 1 tablet (40 mg total) by mouth daily.  90 tablet  3  . tadalafil (CIALIS) 20 MG tablet Take 20 mg by mouth daily as needed.        Marland Kitchen  Testosterone 30 MG/ACT SOLN Place onto the skin daily.          BP 110/80  Pulse 113  Temp(Src) 98.5 F (36.9 C) (Oral)  Wt 233 lb (105.688 kg)chart Objective:   Physical Exam  Constitutional: He is oriented to person, place, and time. He appears well-developed and well-nourished.  HENT:  Right Ear: External ear normal.  Left Ear: External ear normal.  Nose: Nose normal.  Mouth/Throat: Oropharynx is clear and moist.  Neck: Neck supple.  Cardiovascular: Normal rate, regular rhythm and normal heart sounds.   Pulmonary/Chest: Effort normal and breath sounds normal.  Abdominal: Soft. Bowel sounds are normal.  Musculoskeletal: Normal range of motion.  Neurological: He is alert and oriented to person, place, and time.  Skin: Skin is warm and dry.  Psychiatric: He  has a normal mood and affect.      EKG: Within normal limits, no acute abnormality    Assessment & Plan:  Assessment: Cough, GERD, COPD , shortness of breath  Plan: Since EKG was within normal limits I will give him a nonurgent appointment to cardiology. Follow up with pulmonary as scheduled. Tussionex 1 teaspoon twice a day when necessary cough. Prednisone 60 mg by mouth every morning x3 days, 40 mg by mouth every morning x3 days, 20 mg by mouth every morning x3 days. Patient to call the office if symptoms worsen or persist. Keep scheduled appointments. Follow up PCP thereafter.

## 2011-10-24 NOTE — Patient Instructions (Signed)
1. Follow with pulmonary as scheduled. 2. Referral to cardiology done. Followup for further management. 3. Tussionex 1 teaspoon twice a day as needed for cough. 4. Prednisone 20 mg 60 mg by mouth every morning x3 days, 40 mg by mouth every morning x3 days, 20 mg by mouth every morning x3 days. 5. Follow up PCP as scheduled. Call if symptoms worsen or persist.

## 2011-10-25 ENCOUNTER — Other Ambulatory Visit: Payer: Self-pay | Admitting: *Deleted

## 2011-10-25 MED ORDER — FENOFIBRATE 160 MG PO TABS: 160.0000 mg | ORAL_TABLET | Freq: Every day | ORAL | Status: DC

## 2011-10-29 ENCOUNTER — Telehealth: Payer: Self-pay | Admitting: Internal Medicine

## 2011-10-29 NOTE — Telephone Encounter (Signed)
Pt called back again. He really wants this to be resolved today. Please call today if at all possible. Thanks!

## 2011-10-29 NOTE — Telephone Encounter (Signed)
Error/njr °

## 2011-10-29 NOTE — Telephone Encounter (Signed)
Left message on machine Per dr Lovell Sheehan- really needs cxr before starting lasix- come tomorrow am and have cxr at [facility] and then see dr Lovell Sheehan at [facility]

## 2011-10-29 NOTE — Telephone Encounter (Signed)
To dr jenkins 

## 2011-10-29 NOTE — Telephone Encounter (Signed)
Not getting any better. He is having some swelling and wheezing. He has an occasional irregular heartbeat. Requesting Lasix and nebulizer. His daughter is a Secretary/administrator and is suggesting this until is appt with his specialist on the 18th. Please return his call. Thanks.

## 2011-10-30 ENCOUNTER — Other Ambulatory Visit: Payer: Self-pay | Admitting: *Deleted

## 2011-10-30 DIAGNOSIS — R05 Cough: Secondary | ICD-10-CM

## 2011-10-30 NOTE — Telephone Encounter (Signed)
Spoke with carol who took last message and she states that pt states he is 70 miles away and he has too much to do today and cant come in and see dr Lovell Sheehan. And call him if he has any questions

## 2011-10-30 NOTE — Telephone Encounter (Signed)
Pt called and said that he will not be able to come up today to get a cxr or see Dr Lovell Sheehan. Pt said that nurse can call if there are any questions.

## 2011-11-06 ENCOUNTER — Other Ambulatory Visit: Payer: Self-pay

## 2011-11-06 NOTE — Telephone Encounter (Signed)
Pt aware and verbalized understanding. Will call if delsym doesn't work he will call back or prn

## 2011-11-06 NOTE — Telephone Encounter (Signed)
Last OV and  Fill date 10/24/11

## 2011-11-06 NOTE — Telephone Encounter (Signed)
Denied, try OTC delsym.

## 2011-11-19 ENCOUNTER — Other Ambulatory Visit: Payer: Self-pay

## 2011-11-19 NOTE — Telephone Encounter (Signed)
Refill request for tussionex denied, per Orvan Falconer. Pt advised that he needs OV for further refills

## 2011-12-28 ENCOUNTER — Telehealth: Payer: Self-pay | Admitting: Internal Medicine

## 2011-12-28 ENCOUNTER — Other Ambulatory Visit: Payer: Self-pay | Admitting: Internal Medicine

## 2011-12-28 DIAGNOSIS — R05 Cough: Secondary | ICD-10-CM

## 2011-12-28 NOTE — Telephone Encounter (Signed)
Pt would like a referral to gi specialist Dr Valaria Good [phone]. Pt has passed all the tests at pulmonologist Dr Talmadge Coventry and cardiologist Dr Lodema Hong at [facility]. Pt stated next step is gi specialist. Pt said Dr Lovell Sheehan is aware of reasons

## 2011-12-28 NOTE — Telephone Encounter (Signed)
Ok per dr Lovell Sheehan -for chronic cough

## 2012-02-07 IMAGING — CT CT CHEST W/O CM
2 of 4 series · 15 of 36 positions shown, 18 images · non-contrast
Comparison: 12/07/2008

CLINICAL DATA: Lung nodules.  Cough.  Prostate cancer.  Prior
prostatectomy.

CT CHEST WITHOUT CONTRAST
TECHNIQUE: Multidetector CT imaging of the chest was performed
following the standard protocol without IV contrast.

[Series 2: hi-res 5mm · axial · 0.88mm/px · z∈[-314,-34]mm · 12 of 68 slices shown, 15 images]
[im 6/68  mediastinal]
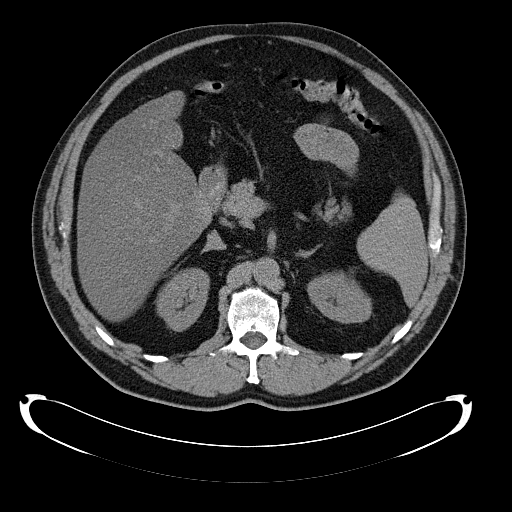
[im 6/68  lung]
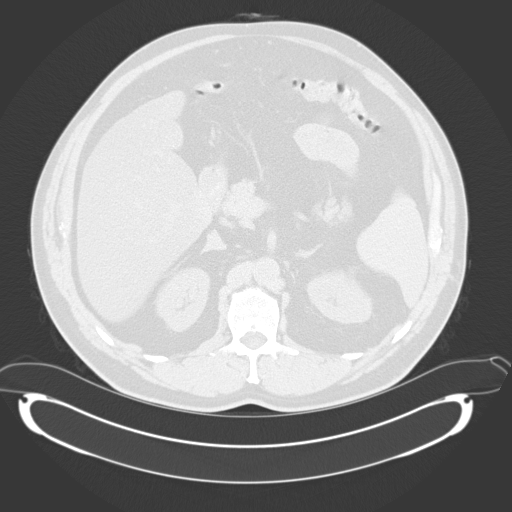
[im 11/68  lung]
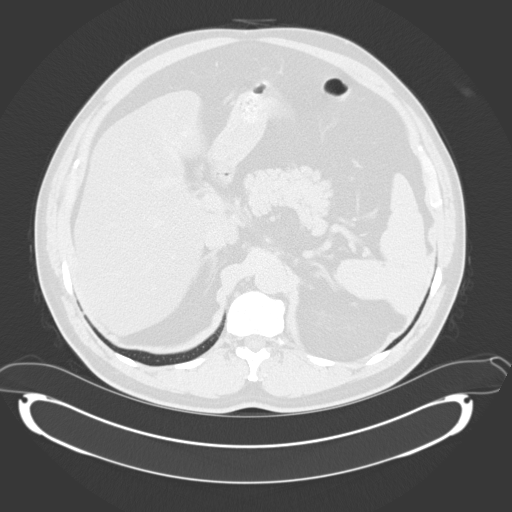
[im 16/68  lung]
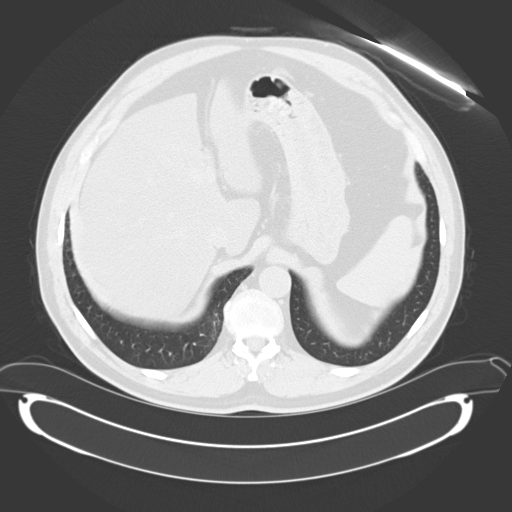
[im 21/68  lung]
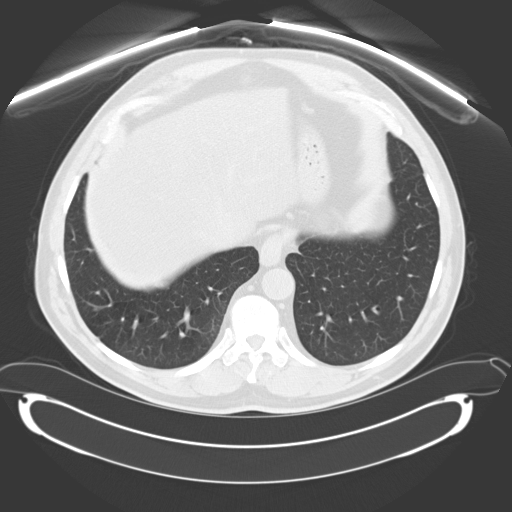
[im 26/68  mediastinal]
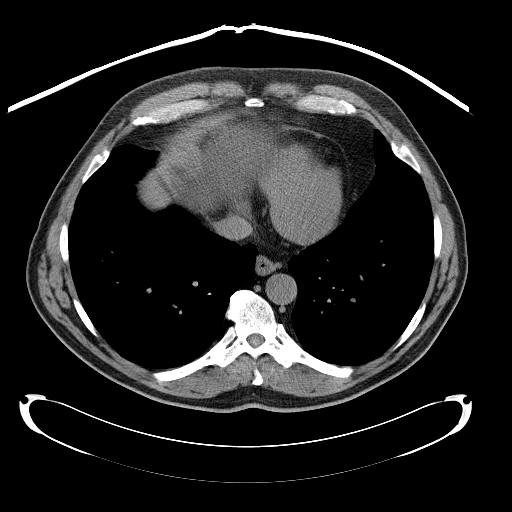
[im 26/68  lung]
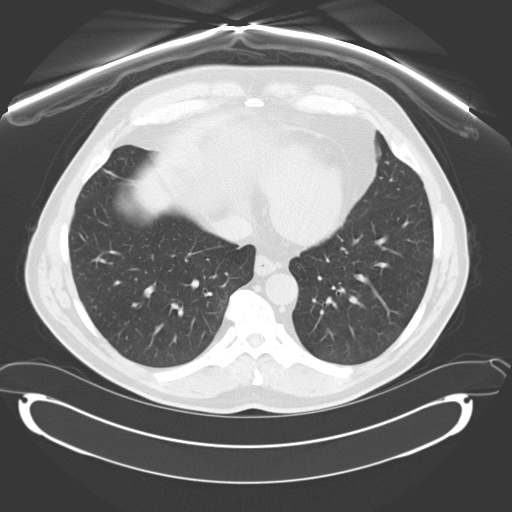
[im 31/68  lung]
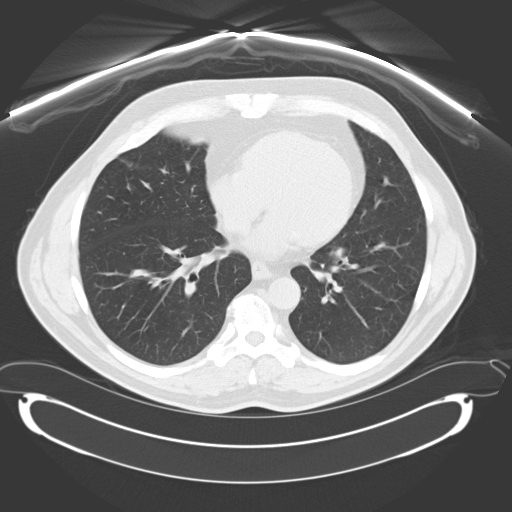
[im 37/68  lung]
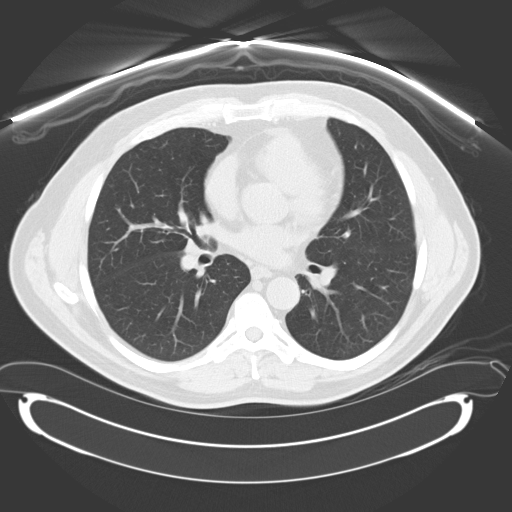
[im 42/68  lung]
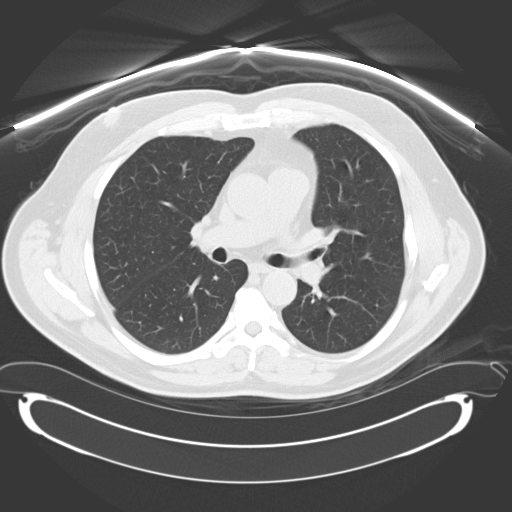
[im 47/68  mediastinal]
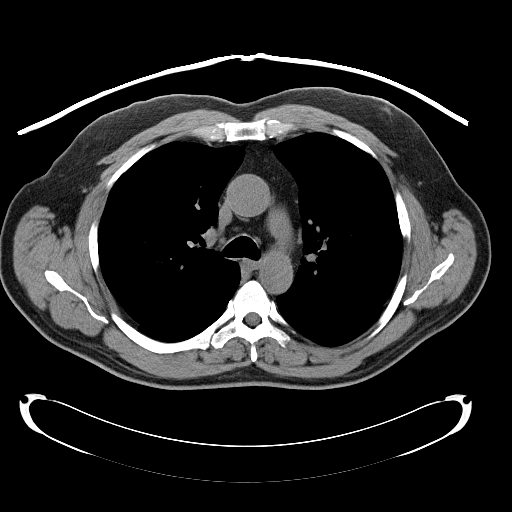
[im 47/68  lung]
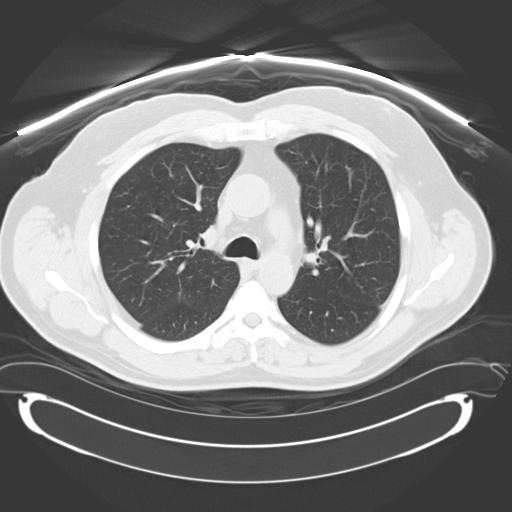
[im 52/68  lung]
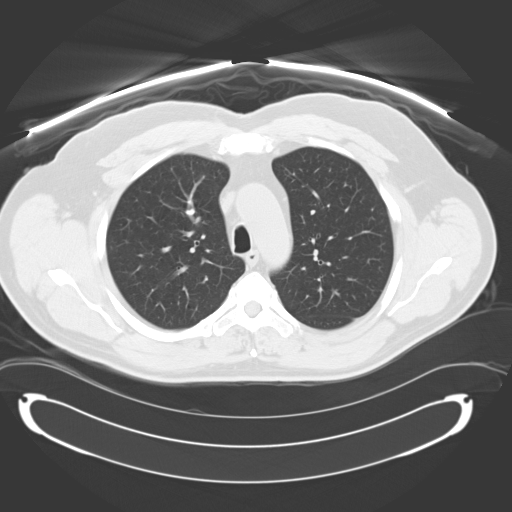
[im 57/68  lung]
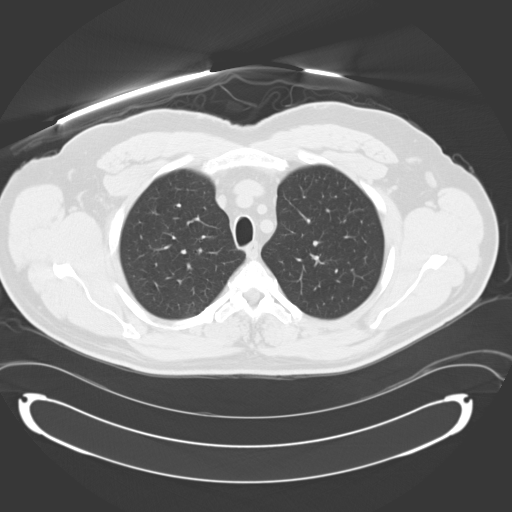
[im 62/68  lung]
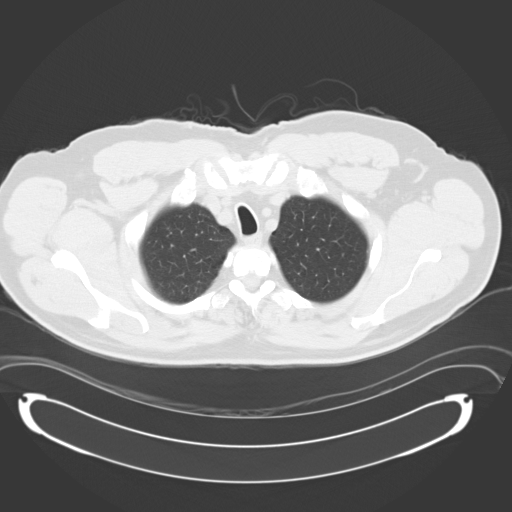

[Series 602: <mpr thick range> · coronal · 0.88mm/px · 3 of 138 slices shown]
[im 28/138  lung]
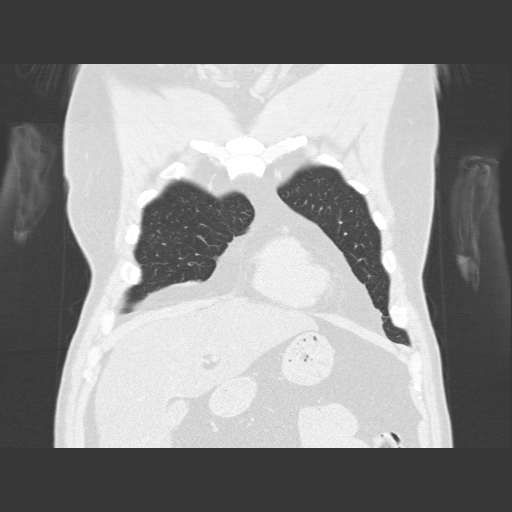
[im 55/138  lung]
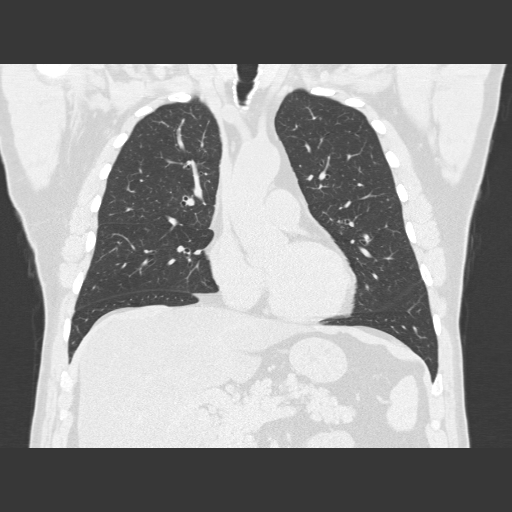
[im 83/138  lung]
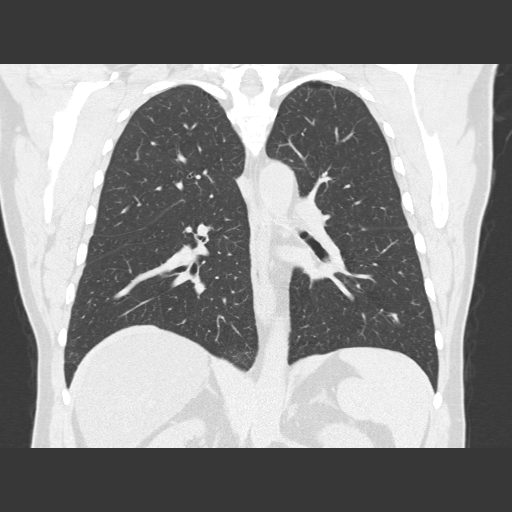

[15 of 36 positions shown; findings below may reference images not displayed]

FINDINGS: No thoracic adenopathy identified.  There is diffuse
steatosis of the visualized portion of the liver.  A small
hypodense lesion in the medial segment left hepatic lobe anteriorly
on image 50 of series 2 appears stable and benign.

Several small pulmonary nodules are identified and are all
unchanged from 01/09/2008.  Accordingly, these are considered
benign.  No further workup is necessary.  The largest of these
measures 5 x 4 mm on image 39 of series 3.

There is mild paraseptal emphysema.
IMPRESSION: 1.  Very small pulmonary nodules are stable from 01/09/2008.  The
lack of interval change indicates benign etiology.  Based on
current guidelines, no further imaging workup is necessary.
2.  Diffuse hepatic steatosis.
3.  Mild paraseptal emphysema.

## 2012-03-28 ENCOUNTER — Ambulatory Visit: Payer: Medicare Other | Admitting: Internal Medicine

## 2012-05-06 ENCOUNTER — Encounter: Payer: Self-pay | Admitting: Gastroenterology

## 2012-10-31 ENCOUNTER — Other Ambulatory Visit: Payer: Self-pay | Admitting: Internal Medicine

## 2013-11-06 ENCOUNTER — Other Ambulatory Visit: Payer: Self-pay | Admitting: Internal Medicine

## 2013-12-22 ENCOUNTER — Ambulatory Visit (INDEPENDENT_AMBULATORY_CARE_PROVIDER_SITE_OTHER): Payer: Medicare Other | Admitting: Emergency Medicine

## 2013-12-22 VITALS — BP 114/74 | HR 83 | Temp 97.6°F | Resp 18 | Ht 70.25 in | Wt 219.0 lb

## 2013-12-22 DIAGNOSIS — J018 Other acute sinusitis: Secondary | ICD-10-CM

## 2013-12-22 DIAGNOSIS — J209 Acute bronchitis, unspecified: Secondary | ICD-10-CM

## 2013-12-22 MED ORDER — PSEUDOEPHEDRINE-GUAIFENESIN ER 60-600 MG PO TB12: 1.0000 | ORAL_TABLET | Freq: Two times a day (BID) | ORAL | Status: AC

## 2013-12-22 MED ORDER — PROMETHAZINE-CODEINE 6.25-10 MG/5ML PO SYRP: 5.0000 mL | ORAL_SOLUTION | Freq: Four times a day (QID) | ORAL | Status: DC | PRN

## 2013-12-22 MED ORDER — AMOXICILLIN-POT CLAVULANATE 875-125 MG PO TABS: 1.0000 | ORAL_TABLET | Freq: Two times a day (BID) | ORAL | Status: DC

## 2013-12-22 NOTE — Progress Notes (Signed)
[facility] [address], [place] [address] [phone]  Date:  12/22/2013   Name:  Eugene Harrington   DOB:  Dec 26, 1945   MRN:  [id number]  PCP:  Georgetta Haber, MD    Chief Complaint: Sinus Pressure, Headache and Sore Throat   History of Present Illness:  Eugene Harrington is a 68 y.o. very pleasant male patient who presents with the following:  Ill three weeks with nasal congestion with purulent nasal drainage.  Has post nasal drainage and a cough with purulent sputum.  No fever or chills.  No wheezing or shortness of breath.  No nausea or vomiting.  No improvement with over the counter medications or other home remedies. Denies other complaint or health concern today.   Patient Active Problem List   Diagnosis Date Noted  . CHRONIC OBSTRUCTIVE ASTHMA WITH EXACERBATION 09/22/2010  . SHORTNESS OF BREATH 09/22/2010  . HEEL PAIN 06/03/2009  . ACTINIC KERATOSIS, FOREHEAD, LEFT 07/12/2008  . FATTY LIVER DISEASE 04/06/2008  . PULMONARY NODULE, RIGHT LOWER LOBE 01/16/2008  . UNS ADVRS EFF UNS RX MEDICINAL&BIOLOGICAL SBSTNC 01/02/2008  . CERUMEN IMPACTION, BILATERAL 09/22/2007  . ACUTE FRONTAL SINUSITIS 09/22/2007  . ALLERGIC RHINITIS 05/19/2007  . OSTEOARTHROSIS, LOCAL NOS, LOWER LEG 05/19/2007  . TESTOSTERONE DEFICIENCY 05/06/2007  . HYPERLIPIDEMIA 05/06/2007  . GERD 05/06/2007  . IRRITABLE BOWEL SYNDROME 05/06/2007  . NEOP, MALIGNANT, PROSTATE 07/19/2004    Past Medical History  Diagnosis Date  . Unspecified adverse effect of unspecified drug, medicinal and biological substance   . Obstructive chronic bronchitis with acute bronchitis   . Impacted cerumen   . Acute frontal sinusitis   . 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 Malignant neoplasm of prostate   . Irritable bowel syndrome   . Other testicular hypofunction   . Hyperlipidemia   . GERD (gastroesophageal reflux disease)     Past  Surgical History  Procedure Laterality Date  . Hiatal hernia repair    . Joint replacement    . Prostate surgery    . Tonsillectomy      History  Substance Use Topics  . Smoking status: Former Smoker    Quit date: 10/22/1990  . Smokeless tobacco: Not on file  . Alcohol Use: No    Family History  Problem Relation Age of Onset  . Cancer Father   . ALS Brother   . Cancer Paternal Grandfather     Allergies  Allergen Reactions  . Iohexol      Desc: PT developed itching on back of head post injection of 80cc's Omni. No hives. 50mg  PO Benedryl given., Onset Date: [id number]     Medication list has been reviewed and updated.  Current Outpatient Prescriptions on File Prior to Visit  Medication Sig Dispense Refill  . aspirin 81 MG tablet Take 81 mg by mouth daily.        . fenofibrate 160 MG tablet TAKE ONE TABLET BY MOUTH EVERY DAY  30 tablet  11  . pantoprazole (PROTONIX) 40 MG tablet Take 1 tablet (40 mg total) by mouth daily.  90 tablet  3  . chlorpheniramine-HYDROcodone (TUSSIONEX PENNKINETIC ER) 10-8 MG/5ML LQCR Take 5 mLs by mouth 2 (two) times daily.  140 mL  0  . esomeprazole (NEXIUM) 40 MG capsule Take 40 mg by mouth daily before breakfast.        . fenofibrate 160 MG tablet TAKE ONE TABLET BY MOUTH EVERY DAY  90 tablet  3  . Fluticasone-Salmeterol (ADVAIR DISKUS) 250-50 MCG/DOSE AEPB Inhale 1 puff into the lungs daily.        . Hydrocodone-Homatropine 5-1.5 MG TABS Take 1 tablet by mouth every 6 (six) hours as needed.  6 each  0  . tadalafil (CIALIS) 20 MG tablet Take 20 mg by mouth daily as needed.        . Testosterone 30 MG/ACT SOLN Place onto the skin daily.         No current facility-administered medications on file prior to visit.    Review of Systems:  As per HPI, otherwise negative.    Physical Examination: Filed Vitals:   12/22/13 1434  BP: 114/74  Pulse: 83  Temp: 97.6 F (36.4 C)  Resp: 18   Filed Vitals:   12/22/13 1434  Height: 5' 10.25"  (1.784 m)  Weight: 219 lb (99.338 kg)   Body mass index is 31.21 kg/(m^2). Ideal Body Weight: Weight in (lb) to have BMI = 25: 175.1  GEN: WDWN, NAD, Non-toxic, A & O x 3 HEENT: Atraumatic, Normocephalic. Neck supple. No masses, No LAD. Ears and Nose: No external deformity. CV: RRR, No M/G/R. No JVD. No thrill. No extra heart sounds. PULM: CTA B, no wheezes, crackles, rhonchi. No retractions. No resp. distress. No accessory muscle use. ABD: S, NT, ND, +BS. No rebound. No HSM. EXTR: No c/c/e NEURO Normal gait.  PSYCH: Normally interactive. Conversant. Not depressed or anxious appearing.  Calm demeanor.    Assessment and Plan: Sinusitis Bronchitis augmentin mucinex Phen c cod   Signed,  Ellison Carwin, MD

## 2013-12-22 NOTE — Patient Instructions (Signed)

## 2013-12-25 ENCOUNTER — Telehealth: Payer: Self-pay

## 2013-12-25 NOTE — Telephone Encounter (Signed)
Patient is requesting additional cough syrup to be [facility] in [place] [phone]

## 2013-12-25 NOTE — Telephone Encounter (Signed)
If pt was taking max as Rxd, he would just now be running out. Dr Ouida Sills, do you want to RF?

## 2013-12-26 ENCOUNTER — Other Ambulatory Visit: Payer: Self-pay | Admitting: Emergency Medicine

## 2013-12-26 MED ORDER — PROMETHAZINE-CODEINE 6.25-10 MG/5ML PO SYRP: 5.0000 mL | ORAL_SOLUTION | Freq: Four times a day (QID) | ORAL | Status: DC | PRN

## 2013-12-26 NOTE — Telephone Encounter (Signed)
Please let him know a prescription was sent.  I printed it, please call it in.  Thanks

## 2013-12-28 NOTE — Telephone Encounter (Signed)
Spoke to Elliott- Rx called in.

## 2015-08-31 ENCOUNTER — Other Ambulatory Visit: Payer: Self-pay | Admitting: Urology

## 2015-10-03 ENCOUNTER — Encounter (HOSPITAL_BASED_OUTPATIENT_CLINIC_OR_DEPARTMENT_OTHER): Payer: Self-pay | Admitting: *Deleted

## 2015-10-04 ENCOUNTER — Encounter (HOSPITAL_BASED_OUTPATIENT_CLINIC_OR_DEPARTMENT_OTHER): Payer: Self-pay | Admitting: *Deleted

## 2015-10-04 NOTE — Progress Notes (Signed)
NPO AFTER MN. ARRIVE AT 0715. NEEDS HG. WILL TAKE AM MEDS W/ SIPS OF WATER. 

## 2015-10-05 NOTE — H&P (Signed)
Reason For Visit Cystoscopy   Active Problems Problems  1. Erectile dysfunction due to arterial insufficiency (N52.01) 2. Gross hematuria (R31.0)   Assessed By: Carolan Clines (Urology); Last Assessed: 30 Aug 2015 3. Hypogonadism, testicular (E29.1) 4. Prostate cancer (C61)   Assessed By: Anders Grant (Urology); Last Assessed: 08 Jul 2013 5. Vitamin D deficiency (E55.9)   Assessed By: Anders Grant (Urology); Last Assessed: 08 Jul 2013  History of Present Illness     69 yo male referred back by Dr. Ponciano Ort for a cystoscopy for recent hx of gross hematuria. He had a CT w/wo abd/pelvis on 08/26/15 that was negative. Hx: prostate cancer: G-6 Cap L lat lobe, with PSA 2.61, post RRP Jan 2005, with post-op PSA 0.0. PSA remains undetectable.    Hx of Hypogonadism: treated with Androgel 1.62% 2 pumps daily.     Hx ED: Cialis 20mg  for ED PRN, but last year, switched back to Trimix injections.     S/P vasectomy on 02/21/11.   Past Medical History Problems  1. History of Cancer 2. History of esophageal reflux (Z87.19) 3. History of Malignant Melanoma Of The Skin  Surgical History Problems  1. History of Cataract Surgery 2. History of Inguinal Hernia Repair 3. History of Knee Replacement 4. History of Prostate Surgery 5. Sterilization consult (Z30.09) 6. Surgery Vas Deferens Vasectomy 7. History of Surgery Vas Deferens Vasectomy  Current Meds 1. Adult Aspirin Low Strength 81 MG TBDP;  Therapy: (Recorded:26Mar2008) to Recorded 2. AndroGel Pump 20.25 MG/ACT (1.62%) Transdermal Gel; APPLY 2 PUMPS EVERY DAY  TO UPPER ARMS OR SHOULDERS;  Therapy: [id number] to (Evaluate:24Dec2016)  Requested for: 27Jun2016; Last  Rx:27Jun2016 Ordered 3. Cialis 20 MG Oral Tablet; TAKE 1 TABLET BY MOUTH DAILY AS NEEDED;  Therapy: [id number] to (Evaluate:16Oct2016)  Requested for: 28Sep2016; Last  Rx:28Sep2016 Ordered 4. DULoxetine HCl - 20 MG Oral Capsule Delayed Release Particles;  Therapy:  (Recorded:08Nov2016) to Recorded 5. Fenofibrate 160 MG Oral Tablet;  Therapy: [id number] to Recorded 6. Trimix (PGE 20, PAP 30, PHE 0.5); Inject 0.25 mL into penile shaft as previously  demonstrated prior to intercourse. MDD:0.25 ml TDD:0.25 ml;  Therapy: 17Sep2014 to (Last Rx:26Sep2016)  Requested for: 27Sep2016 Ordered 7. Vitamin D 1000 UNIT Oral Tablet;  Therapy: (Recorded:17Sep2014) to Recorded  Allergies Medication  1. No Known Drug Allergies  Family History Problems  1. Family history of Family Health Status Number Of Children   1 DAUGHTER  Social History Problems  1. Caffeine Use   10 PER DAY 2. Former smoker [phone]) 3. Tobacco Use   2 PACKS PER DAYSMOKED 27 YEARSQUIT 16 YEARS AGO  Review of Systems Genitourinary, constitutional, skin, eye, otolaryngeal, hematologic/lymphatic, cardiovascular, pulmonary, endocrine, musculoskeletal, gastrointestinal, neurological and psychiatric system(s) were reviewed and pertinent findings if present are noted and are otherwise negative.  Genitourinary: hematuria and erectile dysfunction.    Results/Data Urine [Data Includes: Last 1 Day]   [id number]  COLOR YELLOW   APPEARANCE CLEAR   SPECIFIC GRAVITY 1.010   pH 6.0   GLUCOSE NEGATIVE   BILIRUBIN NEGATIVE   KETONE NEGATIVE   BLOOD 2+   PROTEIN NEGATIVE   NITRITE NEGATIVE   LEUKOCYTE ESTERASE NEGATIVE   SQUAMOUS EPITHELIAL/HPF 0-5 HPF  WBC NONE SEEN WBC/HPF  RBC 0-2 RBC/HPF  BACTERIA NONE SEEN HPF  CRYSTALS NONE SEEN HPF  CASTS NONE SEEN LPF  Yeast NONE SEEN HPF   Procedure  Procedure: Cystoscopy  Chaperone Present: kim lewis.  Indication: Hematuria.  Informed Consent: Risks, benefits, and potential adverse  events were discussed and informed consent was obtained from the patient.  Prep: The patient was prepped with betadine.  Anesthesia:. Local anesthesia was administered intraurethrally with 2% lidocaine jelly.  Antibiotic prophylaxis: Ciprofloxacin.  Procedure  Note:  Urethral meatus:. No abnormalities.  Anterior urethra: No abnormalities.  Prostatic urethra: No abnormalities.  Bladder: Visulization was clear. The ureteral orifices were in the normal anatomic position bilaterally and had clear efflux of urine. A systematic survey of the bladder demonstrated no bladder tumors or stones. The mucosa was smooth without abnormalities. The patient tolerated the procedure well.  Complications: None.    Assessment Assessed  1. Gross hematuria (R31.0) 2. Adenocarcinoma of prostate (C61) 3. Seminal vesiculitis (N49.0)  possible prostatitis/seminal vesiculitis.   Plan Health Maintenance  1. UA With REFLEX; [Do Not Release]; Status:Resulted - Requires Verification;   [id number] 03:14PM Seminal vesiculitis  2. Start: Doxycycline Hyclate 100 MG Oral Capsule; Take 1 capsule twice daily  Doxycycline for 20 days.   Discussion/Summary cc: Nelda Marseille, MD     Signatures Electronically signed by : Carolan Clines, M.D.; Aug 30 2015  5:40PM EST

## 2015-10-06 ENCOUNTER — Ambulatory Visit (HOSPITAL_BASED_OUTPATIENT_CLINIC_OR_DEPARTMENT_OTHER)
Admission: RE | Admit: 2015-10-06 | Discharge: 2015-10-06 | Disposition: A | Discharge status: Home / Self Care | Payer: Medicare Other | Source: Ambulatory Visit | Attending: Urology | Admitting: Urology

## 2015-10-06 ENCOUNTER — Ambulatory Visit (HOSPITAL_BASED_OUTPATIENT_CLINIC_OR_DEPARTMENT_OTHER): Payer: Medicare Other | Admitting: Anesthesiology

## 2015-10-06 ENCOUNTER — Encounter (HOSPITAL_BASED_OUTPATIENT_CLINIC_OR_DEPARTMENT_OTHER): Payer: Self-pay | Admitting: Anesthesiology

## 2015-10-06 ENCOUNTER — Encounter (HOSPITAL_BASED_OUTPATIENT_CLINIC_OR_DEPARTMENT_OTHER)
Admission: RE | Disposition: A | Discharge status: Home / Self Care | Payer: Self-pay | Source: Ambulatory Visit | Attending: Urology

## 2015-10-06 DIAGNOSIS — K219 Gastro-esophageal reflux disease without esophagitis: Secondary | ICD-10-CM | POA: Diagnosis not present

## 2015-10-06 DIAGNOSIS — E559 Vitamin D deficiency, unspecified: Secondary | ICD-10-CM | POA: Insufficient documentation

## 2015-10-06 DIAGNOSIS — Z96659 Presence of unspecified artificial knee joint: Secondary | ICD-10-CM | POA: Diagnosis not present

## 2015-10-06 DIAGNOSIS — N320 Bladder-neck obstruction: Secondary | ICD-10-CM | POA: Insufficient documentation

## 2015-10-06 DIAGNOSIS — C61 Malignant neoplasm of prostate: Secondary | ICD-10-CM | POA: Diagnosis not present

## 2015-10-06 DIAGNOSIS — E291 Testicular hypofunction: Secondary | ICD-10-CM | POA: Diagnosis not present

## 2015-10-06 DIAGNOSIS — J449 Chronic obstructive pulmonary disease, unspecified: Secondary | ICD-10-CM | POA: Diagnosis not present

## 2015-10-06 DIAGNOSIS — Z87891 Personal history of nicotine dependence: Secondary | ICD-10-CM | POA: Diagnosis not present

## 2015-10-06 DIAGNOSIS — R310 Gross hematuria: Secondary | ICD-10-CM | POA: Diagnosis present

## 2015-10-06 DIAGNOSIS — N5201 Erectile dysfunction due to arterial insufficiency: Secondary | ICD-10-CM | POA: Insufficient documentation

## 2015-10-06 HISTORY — DX: Left anterior fascicular block: I44.4

## 2015-10-06 HISTORY — DX: Endothelial corneal dystrophy: H18.51

## 2015-10-06 HISTORY — DX: Unspecified osteoarthritis, unspecified site: M19.90

## 2015-10-06 HISTORY — PX: CYSTOSCOPY: SHX5120

## 2015-10-06 HISTORY — DX: Personal history of malignant melanoma of skin: Z85.820

## 2015-10-06 HISTORY — DX: Other nonspecific abnormal finding of lung field: R91.8

## 2015-10-06 HISTORY — DX: Chronic obstructive pulmonary disease, unspecified: J44.9

## 2015-10-06 HISTORY — PX: BALLOON DILATION: SHX5330

## 2015-10-06 HISTORY — DX: Other seasonal allergic rhinitis: J30.2

## 2015-10-06 HISTORY — DX: Male erectile dysfunction, unspecified: N52.9

## 2015-10-06 HISTORY — DX: Other specified postprocedural states: Z98.890

## 2015-10-06 HISTORY — DX: Attention-deficit hyperactivity disorder, unspecified type: F90.9

## 2015-10-06 HISTORY — DX: Endothelial corneal dystrophy, unspecified eye: H18.519

## 2015-10-06 HISTORY — DX: Personal history of malignant neoplasm of prostate: Z85.46

## 2015-10-06 HISTORY — DX: Bladder-neck obstruction: N32.0

## 2015-10-06 LAB — POCT HEMOGLOBIN-HEMACUE: HEMOGLOBIN: 16.5 g/dL (ref 13.0–17.0)

## 2015-10-06 SURGERY — CYSTOSCOPY
Anesthesia: General | Site: Bladder

## 2015-10-06 MED ORDER — ONDANSETRON HCL 4 MG/2ML IJ SOLN
INTRAMUSCULAR | Status: DC | PRN
  Administered 2015-10-06: 4 mg via INTRAVENOUS

## 2015-10-06 MED ORDER — PROPOFOL 10 MG/ML IV BOLUS
INTRAVENOUS | Status: DC | PRN
  Administered 2015-10-06: 200 mg via INTRAVENOUS

## 2015-10-06 MED ORDER — CEFAZOLIN SODIUM-DEXTROSE 2-3 GM-% IV SOLR
2.0000 g | INTRAVENOUS | Status: AC
  Administered 2015-10-06: 2 g via INTRAVENOUS
  Filled 2015-10-06: qty 50

## 2015-10-06 MED ORDER — FENTANYL CITRATE (PF) 100 MCG/2ML IJ SOLN
INTRAMUSCULAR | Status: AC
  Filled 2015-10-06: qty 2

## 2015-10-06 MED ORDER — TRAMADOL-ACETAMINOPHEN 37.5-325 MG PO TABS: 1.0000 | ORAL_TABLET | Freq: Four times a day (QID) | ORAL | Status: AC | PRN

## 2015-10-06 MED ORDER — BELLADONNA ALKALOIDS-OPIUM 16.2-60 MG RE SUPP
RECTAL | Status: AC
  Filled 2015-10-06: qty 1

## 2015-10-06 MED ORDER — ACETAMINOPHEN 10 MG/ML IV SOLN
INTRAVENOUS | Status: DC | PRN
  Administered 2015-10-06: 1000 mg via INTRAVENOUS

## 2015-10-06 MED ORDER — HYDROMORPHONE HCL 1 MG/ML IJ SOLN
0.2500 mg | INTRAMUSCULAR | Status: DC | PRN
  Filled 2015-10-06: qty 1

## 2015-10-06 MED ORDER — LIDOCAINE HCL (CARDIAC) 20 MG/ML IV SOLN
INTRAVENOUS | Status: DC | PRN
Start: 2015-10-06 — End: 2015-10-06
  Administered 2015-10-06: 100 mg via INTRAVENOUS

## 2015-10-06 MED ORDER — ACETAMINOPHEN 10 MG/ML IV SOLN
INTRAVENOUS | Status: AC
  Filled 2015-10-06: qty 100

## 2015-10-06 MED ORDER — DEXAMETHASONE SODIUM PHOSPHATE 4 MG/ML IJ SOLN
INTRAMUSCULAR | Status: DC | PRN
  Administered 2015-10-06: 10 mg via INTRAVENOUS

## 2015-10-06 MED ORDER — ARTIFICIAL TEARS OP OINT
TOPICAL_OINTMENT | OPHTHALMIC | Status: AC
  Filled 2015-10-06: qty 3.5

## 2015-10-06 MED ORDER — KETOROLAC TROMETHAMINE 30 MG/ML IJ SOLN
INTRAMUSCULAR | Status: DC | PRN
  Administered 2015-10-06: 30 mg via INTRAVENOUS

## 2015-10-06 MED ORDER — SODIUM CHLORIDE 0.9 % IR SOLN
Status: DC | PRN
  Administered 2015-10-06: 3000 mL via INTRAVESICAL

## 2015-10-06 MED ORDER — PROPOFOL 10 MG/ML IV BOLUS
INTRAVENOUS | Status: AC
  Filled 2015-10-06: qty 20

## 2015-10-06 MED ORDER — PROMETHAZINE HCL 25 MG/ML IJ SOLN
6.2500 mg | INTRAMUSCULAR | Status: DC | PRN
  Filled 2015-10-06: qty 1

## 2015-10-06 MED ORDER — URELLE 81 MG PO TABS
1.0000 | ORAL_TABLET | Freq: Four times a day (QID) | ORAL | Status: DC
  Administered 2015-10-06: 81 mg via ORAL
  Filled 2015-10-06: qty 1

## 2015-10-06 MED ORDER — MIDAZOLAM HCL 2 MG/2ML IJ SOLN
INTRAMUSCULAR | Status: AC
  Filled 2015-10-06: qty 2

## 2015-10-06 MED ORDER — ONDANSETRON HCL 4 MG/2ML IJ SOLN
INTRAMUSCULAR | Status: AC
  Filled 2015-10-06: qty 2

## 2015-10-06 MED ORDER — URELLE 81 MG PO TABS
ORAL_TABLET | ORAL | Status: AC
  Filled 2015-10-06: qty 1

## 2015-10-06 MED ORDER — MIDAZOLAM HCL 5 MG/5ML IJ SOLN
INTRAMUSCULAR | Status: DC | PRN
  Administered 2015-10-06: 2 mg via INTRAVENOUS

## 2015-10-06 MED ORDER — IOHEXOL 350 MG/ML SOLN
INTRAVENOUS | Status: DC | PRN
  Administered 2015-10-06: 10 mL

## 2015-10-06 MED ORDER — TRIMETHOPRIM 100 MG PO TABS: 100.0000 mg | ORAL_TABLET | ORAL | Status: AC

## 2015-10-06 MED ORDER — LIDOCAINE HCL (CARDIAC) 20 MG/ML IV SOLN
INTRAVENOUS | Status: AC
  Filled 2015-10-06: qty 5

## 2015-10-06 MED ORDER — LACTATED RINGERS IV SOLN
INTRAVENOUS | Status: DC
  Administered 2015-10-06 (×2): via INTRAVENOUS
  Filled 2015-10-06: qty 1000

## 2015-10-06 MED ORDER — FENTANYL CITRATE (PF) 100 MCG/2ML IJ SOLN
INTRAMUSCULAR | Status: DC | PRN
  Administered 2015-10-06 (×2): 50 ug via INTRAVENOUS

## 2015-10-06 MED ORDER — CEFAZOLIN SODIUM-DEXTROSE 2-3 GM-% IV SOLR
INTRAVENOUS | Status: AC
  Filled 2015-10-06: qty 50

## 2015-10-06 MED ORDER — DEXAMETHASONE SODIUM PHOSPHATE 10 MG/ML IJ SOLN
INTRAMUSCULAR | Status: AC
  Filled 2015-10-06: qty 1

## 2015-10-06 MED ORDER — KETOROLAC TROMETHAMINE 30 MG/ML IJ SOLN
INTRAMUSCULAR | Status: AC
  Filled 2015-10-06: qty 1

## 2015-10-06 MED ORDER — URIBEL 118 MG PO CAPS
1.0000 | ORAL_CAPSULE | Freq: Two times a day (BID) | ORAL | Status: AC | PRN
Start: 2015-10-06 — End: ?

## 2015-10-06 SURGICAL SUPPLY — 28 items
BAG DRAIN URO-CYSTO SKYTR STRL (DRAIN) ×4 IMPLANT
BAG URINE DRAINAGE (UROLOGICAL SUPPLIES) IMPLANT
BAG URINE LEG 500ML (DRAIN) IMPLANT
BALLN NEPHROMAX 24X8X12 (CATHETERS) ×4
BALLOON NEPHROMAX 24X8X12 (CATHETERS) ×2 IMPLANT
CATH FOLEY 2WAY SLVR  5CC 16FR (CATHETERS)
CATH FOLEY 2WAY SLVR 5CC 16FR (CATHETERS) IMPLANT
CLOTH BEACON ORANGE TIMEOUT ST (SAFETY) ×4 IMPLANT
GLOVE BIO SURGEON STRL SZ 6.5 (GLOVE) ×6 IMPLANT
GLOVE BIO SURGEON STRL SZ7.5 (GLOVE) ×4 IMPLANT
GLOVE BIO SURGEONS STRL SZ 6.5 (GLOVE) ×2
GOWN STRL REUS W/ TWL LRG LVL3 (GOWN DISPOSABLE) ×2 IMPLANT
GOWN STRL REUS W/ TWL XL LVL3 (GOWN DISPOSABLE) ×2 IMPLANT
GOWN STRL REUS W/TWL LRG LVL3 (GOWN DISPOSABLE) ×2
GOWN STRL REUS W/TWL XL LVL3 (GOWN DISPOSABLE) ×2
GUIDEWIRE STR DUAL SENSOR (WIRE) ×4 IMPLANT
HOLDER FOLEY CATH W/STRAP (MISCELLANEOUS) IMPLANT
IV NS IRRIG 3000ML ARTHROMATIC (IV SOLUTION) ×4 IMPLANT
KIT ROOM TURNOVER WOR (KITS) ×4 IMPLANT
MANIFOLD NEPTUNE II (INSTRUMENTS) ×4 IMPLANT
NS IRRIG 500ML POUR BTL (IV SOLUTION) ×4 IMPLANT
PACK CYSTO (CUSTOM PROCEDURE TRAY) ×4 IMPLANT
PLUG CATH AND CAP STER (CATHETERS) IMPLANT
SET ASPIRATION TUBING (TUBING) IMPLANT
SYRINGE IRR TOOMEY STRL 70CC (SYRINGE) IMPLANT
TUBE CONNECTING 12'X1/4 (SUCTIONS) ×1
TUBE CONNECTING 12X1/4 (SUCTIONS) ×3 IMPLANT
WATER STERILE IRR 3000ML UROMA (IV SOLUTION) IMPLANT

## 2015-10-06 NOTE — Anesthesia Preprocedure Evaluation (Signed)
Anesthesia Evaluation  Patient identified by MRN, date of birth, ID band Patient awake    Reviewed: Allergy & Precautions, NPO status , Patient's Chart, lab work & pertinent test results  Airway Mallampati: II  TM Distance: >3 FB Neck ROM: Full    Dental   Pulmonary shortness of breath, COPD, former smoker,    breath sounds clear to auscultation       Cardiovascular + dysrhythmias  Rhythm:Regular Rate:Normal     Neuro/Psych    GI/Hepatic Neg liver ROS, GERD  ,  Endo/Other  negative endocrine ROS  Renal/GU negative Renal ROS     Musculoskeletal   Abdominal   Peds  Hematology   Anesthesia Other Findings   Reproductive/Obstetrics                             Anesthesia Physical Anesthesia Plan  ASA: III  Anesthesia Plan: General   Post-op Pain Management:    Induction: Intravenous  Airway Management Planned: LMA  Additional Equipment:   Intra-op Plan:   Post-operative Plan: Extubation in OR  Informed Consent: I have reviewed the patients History and Physical, chart, labs and discussed the procedure including the risks, benefits and alternatives for the proposed anesthesia with the patient or authorized representative who has indicated his/her understanding and acceptance.   Dental advisory given  Plan Discussed with: CRNA and Anesthesiologist  Anesthesia Plan Comments:         Anesthesia Quick Evaluation

## 2015-10-06 NOTE — Anesthesia Procedure Notes (Signed)
Procedure Name: LMA Insertion Date/Time: 10/06/2015 8:58 AM Performed by: Mechele Claude Pre-anesthesia Checklist: Patient identified, Emergency Drugs available, Suction available and Patient being monitored Patient Re-evaluated:Patient Re-evaluated prior to inductionOxygen Delivery Method: Circle System Utilized Preoxygenation: Pre-oxygenation with 100% oxygen Intubation Type: IV induction Ventilation: Mask ventilation without difficulty LMA: LMA inserted LMA Size: 4.0 Number of attempts: 1 Airway Equipment and Method: bite block Placement Confirmation: positive ETCO2 Tube secured with: Tape Dental Injury: Teeth and Oropharynx as per pre-operative assessment

## 2015-10-06 NOTE — Transfer of Care (Signed)
Last Vitals:  Filed Vitals:   10/06/15 0709  BP: 131/78  Pulse: 72  Temp: 36.9 C  Resp: 16    Immediate Anesthesia Transfer of Care Note  Patient: Eugene Harrington  Procedure(s) Performed: Procedure(s) (LRB): CYSTOSCOPY (N/A) BALLOON DILATION (N/A)  Patient Location: PACU  Anesthesia Type: General  Level of Consciousness: awake, alert  and oriented  Airway & Oxygen Therapy: Patient Spontanous Breathing and Patient connected to face mask oxygen  Post-op Assessment: Report given to PACU RN and Post -op Vital signs reviewed and stable  Post vital signs: Reviewed and stable  Complications: No apparent anesthesia complications

## 2015-10-06 NOTE — Op Note (Signed)
Pre-operative diagnosis :   Bladder neck contracture  Postoperative diagnosis: Same  Operation:  Cystourethroscopy, [employer] balloon dilation of bladder neck contracture  Surgeon:  S. Gaynelle Arabian, MD  First assistant: None    Anesthesia:  General LMA   Preparation:  After appropriate preanesthesia, the patient was brought to the operative room, placed on the operating table in the dorsal supine position were general LMA anesthesia was introduced. He was replaced in the dorsal lithotomy position with pubis was prepped with Betadine solution and draped in the usual fashion. The history was reviewed. The arm and was double checked.   Review history:  Problems  1. Erectile dysfunction due to arterial insufficiency (N52.01) 2. Gross hematuria (R31.0)  Assessed By: Carolan Clines (Urology); Last Assessed: 30 Aug 2015 3. Hypogonadism, testicular (E29.1) 4. Prostate cancer (C61)  Assessed By: Anders Grant (Urology); Last Assessed: 08 Jul 2013 5. Vitamin D deficiency (E55.9)  Assessed By: Anders Grant (Urology); Last Assessed: 08 Jul 2013  History of Present Illness    69 yo male referred back by Dr. Ponciano Ort for a cystoscopy for recent hx of gross hematuria. He had a CT w/wo abd/pelvis on 08/26/15 that was negative. Hx: prostate cancer: G-6 Cap L lat lobe, with PSA 2.61, post RRP Jan 2005, with post-op PSA 0.0. PSA remains undetectable.    Hx of Hypogonadism: treated with Androgel 1.62% 2 pumps daily.     Hx ED: Cialis 21m for ED PRN, but last year, switched back to Trimix injections.   Cystoscopy showed Bladder neck contracturel.   Statement of  Likelihood of Success: Excellent. TIME-OUT observed.:  Procedure:  Cystourethroscopy, common which showed bladder neck contracture as identified in the office. Guidewire was passed through the bladder neck contracture and coiled in the bladder under fluoroscopic control. [facility] device was then  passed through the contracture into the bladder, under fluoroscopic control. Using a 50% solution of contrast, and water, the balloon was inflated to 18 atm, for 5 minutes. X-ray doctor mentation of wasting was accomplished, an x-ray doctor mentation of resolution of the wasting was also accomplished. Following 5 minutes of balloon dilation, the balloon was removed, and repeat cystoscopy revealed excellent circumferential dilation of the bladder neck contracture. The guidewire was removed. The bladder is drained of fluid. There was no bleeding. The patient was wakened and taken to recovery room in good condition. He received IV antibiotic, and IV Toradol.

## 2015-10-06 NOTE — Discharge Instructions (Signed)

## 2015-10-06 NOTE — Interval H&P Note (Signed)
History and Physical Interval Note:  10/06/2015 9:23 AM  Eugene Harrington  has presented today for surgery, with the diagnosis of bladder neck contracture  The various methods of treatment have been discussed with the patient and family. After consideration of risks, benefits and other options for treatment, the patient has consented to  Procedure(s): CYSTOSCOPY (N/A) BALLOON DILATION (N/A) as a surgical intervention .  The patient's history has been reviewed, patient examined, no change in status, stable for surgery.  I have reviewed the patient's chart and labs.  Questions were answered to the patient's satisfaction.     Lajoya Dombek I Latesia Norrington

## 2015-10-06 NOTE — Anesthesia Postprocedure Evaluation (Signed)
Anesthesia Post Note  Patient: Eugene Harrington  Procedure(s) Performed: Procedure(s) (LRB): CYSTOSCOPY (N/A) BALLOON DILATION (N/A)  Patient location during evaluation: PACU Anesthesia Type: General Level of consciousness: awake Respiratory status: spontaneous breathing Cardiovascular status: stable Anesthetic complications: no    Last Vitals:  Filed Vitals:   10/06/15 0945 10/06/15 1000  BP: 101/72 119/65  Pulse: 79 80  Temp:    Resp: 21 19    Last Pain: There were no vitals filed for this visit.               EDWARDS,Brinlee Gambrell

## 2015-10-07 ENCOUNTER — Encounter (HOSPITAL_BASED_OUTPATIENT_CLINIC_OR_DEPARTMENT_OTHER): Payer: Self-pay | Admitting: Urology
